# Patient Record
Sex: Female | Born: 1965 | Race: White | Hispanic: No | Marital: Married | Smoking: Never smoker
Health system: Southern US, Community
[De-identification: ages and names within clinical notes are randomized; demographics above are authoritative.]

## PROBLEM LIST (undated history)

## (undated) ENCOUNTER — Emergency Department (HOSPITAL_BASED_OUTPATIENT_CLINIC_OR_DEPARTMENT_OTHER): Payer: BC Managed Care – PPO | Source: Home / Self Care

## (undated) DIAGNOSIS — R030 Elevated blood-pressure reading, without diagnosis of hypertension: Secondary | ICD-10-CM

## (undated) DIAGNOSIS — I10 Essential (primary) hypertension: Secondary | ICD-10-CM

## (undated) DIAGNOSIS — M5090 Cervical disc disorder, unspecified, unspecified cervical region: Secondary | ICD-10-CM

## (undated) DIAGNOSIS — M7541 Impingement syndrome of right shoulder: Secondary | ICD-10-CM

## (undated) HISTORY — PX: TONSILLECTOMY: SUR1361

## (undated) HISTORY — DX: Cervical disc disorder, unspecified, unspecified cervical region: M50.90

## (undated) HISTORY — DX: Elevated blood-pressure reading, without diagnosis of hypertension: R03.0

## (undated) HISTORY — DX: Essential (primary) hypertension: I10

## (undated) HISTORY — PX: TOTAL SHOULDER ARTHROPLASTY: SHX126

---

## 1999-02-27 ENCOUNTER — Other Ambulatory Visit: Admission: RE | Admit: 1999-02-27 | Discharge: 1999-02-27 | Payer: Self-pay | Admitting: *Deleted

## 2000-03-09 ENCOUNTER — Inpatient Hospital Stay (HOSPITAL_COMMUNITY): Admission: RE | Admit: 2000-03-09 | Discharge: 2000-03-11 | Payer: Self-pay | Admitting: *Deleted

## 2000-03-09 HISTORY — PX: VAGINAL HYSTERECTOMY: SUR661

## 2005-07-09 ENCOUNTER — Emergency Department (HOSPITAL_COMMUNITY): Admission: EM | Admit: 2005-07-09 | Discharge: 2005-07-09 | Payer: Self-pay | Admitting: Emergency Medicine

## 2010-07-08 ENCOUNTER — Other Ambulatory Visit: Payer: Self-pay | Admitting: Chiropractic Medicine

## 2010-07-08 ENCOUNTER — Ambulatory Visit
Admission: RE | Admit: 2010-07-08 | Discharge: 2010-07-08 | Disposition: A | Discharge status: Home / Self Care | Payer: Self-pay | Source: Ambulatory Visit | Attending: Chiropractic Medicine | Admitting: Chiropractic Medicine

## 2010-07-08 DIAGNOSIS — R609 Edema, unspecified: Secondary | ICD-10-CM

## 2010-08-03 ENCOUNTER — Other Ambulatory Visit: Payer: Self-pay | Admitting: Chiropractic Medicine

## 2010-08-03 DIAGNOSIS — M25561 Pain in right knee: Secondary | ICD-10-CM

## 2010-08-05 ENCOUNTER — Ambulatory Visit
Admission: RE | Admit: 2010-08-05 | Discharge: 2010-08-05 | Disposition: A | Discharge status: Home / Self Care | Payer: 59 | Source: Ambulatory Visit | Attending: Chiropractic Medicine | Admitting: Chiropractic Medicine

## 2010-08-05 DIAGNOSIS — M25561 Pain in right knee: Secondary | ICD-10-CM

## 2011-05-18 HISTORY — PX: SHOULDER SURGERY: SHX246

## 2011-07-01 ENCOUNTER — Other Ambulatory Visit: Payer: Self-pay | Admitting: Otolaryngology

## 2011-07-06 ENCOUNTER — Encounter (HOSPITAL_BASED_OUTPATIENT_CLINIC_OR_DEPARTMENT_OTHER): Payer: Self-pay | Admitting: *Deleted

## 2011-07-06 NOTE — Progress Notes (Signed)
NPO AFTER MN WITH EXCEPTION WATER/ GATORADE UNTIL 0700. NEEDS HG. 

## 2011-07-09 ENCOUNTER — Encounter (HOSPITAL_BASED_OUTPATIENT_CLINIC_OR_DEPARTMENT_OTHER): Payer: Self-pay | Admitting: Anesthesiology

## 2011-07-09 ENCOUNTER — Encounter (HOSPITAL_BASED_OUTPATIENT_CLINIC_OR_DEPARTMENT_OTHER): Payer: Self-pay | Admitting: *Deleted

## 2011-07-09 ENCOUNTER — Ambulatory Visit (HOSPITAL_BASED_OUTPATIENT_CLINIC_OR_DEPARTMENT_OTHER): Payer: BC Managed Care – PPO | Admitting: Anesthesiology

## 2011-07-09 ENCOUNTER — Encounter (HOSPITAL_BASED_OUTPATIENT_CLINIC_OR_DEPARTMENT_OTHER)
Admission: RE | Disposition: A | Discharge status: Home / Self Care | Payer: Self-pay | Source: Ambulatory Visit | Attending: Specialist

## 2011-07-09 ENCOUNTER — Ambulatory Visit (HOSPITAL_BASED_OUTPATIENT_CLINIC_OR_DEPARTMENT_OTHER)
Admission: RE | Admit: 2011-07-09 | Discharge: 2011-07-09 | Disposition: A | Discharge status: Home / Self Care | Payer: BC Managed Care – PPO | Source: Ambulatory Visit | Attending: Specialist | Admitting: Specialist

## 2011-07-09 DIAGNOSIS — M24119 Other articular cartilage disorders, unspecified shoulder: Secondary | ICD-10-CM | POA: Insufficient documentation

## 2011-07-09 DIAGNOSIS — M25519 Pain in unspecified shoulder: Secondary | ICD-10-CM | POA: Insufficient documentation

## 2011-07-09 DIAGNOSIS — M25819 Other specified joint disorders, unspecified shoulder: Secondary | ICD-10-CM | POA: Insufficient documentation

## 2011-07-09 DIAGNOSIS — M7540 Impingement syndrome of unspecified shoulder: Secondary | ICD-10-CM

## 2011-07-09 DIAGNOSIS — M249 Joint derangement, unspecified: Secondary | ICD-10-CM | POA: Insufficient documentation

## 2011-07-09 DIAGNOSIS — M6580 Other synovitis and tenosynovitis, unspecified site: Secondary | ICD-10-CM | POA: Insufficient documentation

## 2011-07-09 HISTORY — DX: Impingement syndrome of right shoulder: M75.41

## 2011-07-09 SURGERY — SHOULDER ARTHROSCOPY WITH SUBACROMIAL DECOMPRESSION
Anesthesia: General | Site: Shoulder | Laterality: Right | Wound class: Clean

## 2011-07-09 MED ORDER — LIDOCAINE HCL (CARDIAC) 20 MG/ML IV SOLN
INTRAVENOUS | Status: DC | PRN
  Administered 2011-07-09: 50 mg via INTRAVENOUS

## 2011-07-09 MED ORDER — BUPIVACAINE-EPINEPHRINE 0.25% -1:200000 IJ SOLN
INTRAMUSCULAR | Status: DC | PRN
  Administered 2011-07-09: 20 mL

## 2011-07-09 MED ORDER — FENTANYL CITRATE 0.05 MG/ML IJ SOLN
INTRAMUSCULAR | Status: DC | PRN
  Administered 2011-07-09: 100 ug via INTRAVENOUS

## 2011-07-09 MED ORDER — PROPOFOL 10 MG/ML IV EMUL
INTRAVENOUS | Status: DC | PRN
  Administered 2011-07-09: 200 mg via INTRAVENOUS

## 2011-07-09 MED ORDER — CEFAZOLIN SODIUM 1-5 GM-% IV SOLN
1.0000 g | INTRAVENOUS | Status: AC
  Administered 2011-07-09: 1 g via INTRAVENOUS

## 2011-07-09 MED ORDER — CHLORHEXIDINE GLUCONATE 4 % EX LIQD: 60.0000 mL | Freq: Once | CUTANEOUS | Status: DC

## 2011-07-09 MED ORDER — MIDAZOLAM HCL 5 MG/5ML IJ SOLN
INTRAMUSCULAR | Status: DC | PRN
  Administered 2011-07-09: 2 mg via INTRAVENOUS

## 2011-07-09 MED ORDER — SUCCINYLCHOLINE CHLORIDE 20 MG/ML IJ SOLN
INTRAMUSCULAR | Status: DC | PRN
  Administered 2011-07-09: 100 mg via INTRAVENOUS

## 2011-07-09 MED ORDER — ROPIVACAINE HCL 5 MG/ML IJ SOLN
INTRAMUSCULAR | Status: DC | PRN
  Administered 2011-07-09: 20 mL

## 2011-07-09 MED ORDER — HYDROCODONE-ACETAMINOPHEN 7.5-325 MG PO TABS: 1.0000 | ORAL_TABLET | ORAL | Status: AC | PRN

## 2011-07-09 MED ORDER — DEXAMETHASONE SODIUM PHOSPHATE 4 MG/ML IJ SOLN
INTRAMUSCULAR | Status: DC | PRN
  Administered 2011-07-09: 10 mg via INTRAVENOUS

## 2011-07-09 MED ORDER — LACTATED RINGERS IV SOLN: INTRAVENOUS | Status: DC

## 2011-07-09 MED ORDER — SODIUM CHLORIDE 0.9 % IR SOLN
Status: DC | PRN
  Administered 2011-07-09: 6000 mL

## 2011-07-09 MED ORDER — METHOCARBAMOL 500 MG PO TABS: 500.0000 mg | ORAL_TABLET | Freq: Four times a day (QID) | ORAL | Status: AC

## 2011-07-09 MED ORDER — KETOROLAC TROMETHAMINE 30 MG/ML IJ SOLN: 15.0000 mg | Freq: Once | INTRAMUSCULAR | Status: DC | PRN

## 2011-07-09 MED ORDER — EPINEPHRINE HCL 1 MG/ML IJ SOLN
INTRAMUSCULAR | Status: DC | PRN
  Administered 2011-07-09: 2 mg

## 2011-07-09 MED ORDER — KETOROLAC TROMETHAMINE 10 MG PO TABS: 10.0000 mg | ORAL_TABLET | Freq: Four times a day (QID) | ORAL | Status: AC | PRN

## 2011-07-09 MED ORDER — ONDANSETRON HCL 4 MG/2ML IJ SOLN
INTRAMUSCULAR | Status: DC | PRN
  Administered 2011-07-09: 4 mg via INTRAVENOUS

## 2011-07-09 MED ORDER — HYDROMORPHONE HCL PF 1 MG/ML IJ SOLN: 0.2500 mg | INTRAMUSCULAR | Status: DC | PRN

## 2011-07-09 MED ORDER — PROMETHAZINE HCL 25 MG/ML IJ SOLN: 6.2500 mg | INTRAMUSCULAR | Status: DC | PRN

## 2011-07-09 MED ORDER — LACTATED RINGERS IV SOLN
INTRAVENOUS | Status: DC
  Administered 2011-07-09: 12:00:00 via INTRAVENOUS

## 2011-07-09 MED ORDER — MIDAZOLAM HCL 2 MG/2ML IJ SOLN
2.0000 mg | Freq: Once | INTRAMUSCULAR | Status: AC
  Administered 2011-07-09: 2 mg via INTRAVENOUS

## 2011-07-09 MED ORDER — FENTANYL CITRATE 0.05 MG/ML IJ SOLN
100.0000 ug | Freq: Once | INTRAMUSCULAR | Status: AC
  Administered 2011-07-09: 100 ug via INTRAVENOUS

## 2011-07-09 SURGICAL SUPPLY — 70 items
APL SKNCLS STERI-STRIP NONHPOA (GAUZE/BANDAGES/DRESSINGS)
BENZOIN TINCTURE PRP APPL 2/3 (GAUZE/BANDAGES/DRESSINGS) IMPLANT
BLADE 4.2CUDA (BLADE) ×2 IMPLANT
BLADE CUDA SHAVER 3.5 (BLADE) ×2 IMPLANT
BLADE CUTTER GATOR 3.5 (BLADE) IMPLANT
BLADE FLAT COURSE (BLADE) IMPLANT
BLADE GREAT WHITE 4.2 (BLADE) IMPLANT
BLADE SURG 11 STRL SS (BLADE) ×2 IMPLANT
BNDG COHESIVE 4X5 TAN NS LF (GAUZE/BANDAGES/DRESSINGS) ×2 IMPLANT
BUR OVAL 4.0 (BURR) IMPLANT
BUR VERTEX HOODED 4.5 (BURR) IMPLANT
CANISTER SUCT LVC 12 LTR MEDI- (MISCELLANEOUS) ×2 IMPLANT
CANISTER SUCTION 2500CC (MISCELLANEOUS) IMPLANT
CANNULA ACUFLEX KIT 5X76 (CANNULA) ×2 IMPLANT
CANNULA SHOULDER 7CM (CANNULA) IMPLANT
CANNULA TWIST IN 8.25X7CM (CANNULA) IMPLANT
CLEANER CAUTERY TIP 5X5 PAD (MISCELLANEOUS) IMPLANT
CLOTH BEACON ORANGE TIMEOUT ST (SAFETY) ×2 IMPLANT
DRAPE LG THREE QUARTER DISP (DRAPES) IMPLANT
DRAPE STERI 35X30 U-POUCH (DRAPES) ×2 IMPLANT
DRAPE U 20/CS (DRAPES) ×4 IMPLANT
DRSG PAD ABDOMINAL 8X10 ST (GAUZE/BANDAGES/DRESSINGS) ×2 IMPLANT
DURAPREP 26ML APPLICATOR (WOUND CARE) ×2 IMPLANT
ELECT MENISCUS 165MM 90D (ELECTRODE) IMPLANT
ELECT NEEDLE TIP 2.8 STRL (NEEDLE) ×2 IMPLANT
ELECT REM PT RETURN 9FT ADLT (ELECTROSURGICAL) ×2
ELECTRODE REM PT RTRN 9FT ADLT (ELECTROSURGICAL) ×1 IMPLANT
GLOVE BIO SURGEON STRL SZ 6.5 (GLOVE) ×2 IMPLANT
GLOVE INDICATOR 7.0 STRL GRN (GLOVE) ×2 IMPLANT
GLOVE SURG SS PI 8.0 STRL IVOR (GLOVE) ×2 IMPLANT
GOWN STRL NON-REIN LRG LVL3 (GOWN DISPOSABLE) ×2 IMPLANT
NDL SAFETY ECLIPSE 18X1.5 (NEEDLE) ×1 IMPLANT
NDL SUT 6 .5 CRC .975X.05 MAYO (NEEDLE) IMPLANT
NEEDLE 1/2 CIR CATGUT .05X1.09 (NEEDLE) IMPLANT
NEEDLE FILTER BLUNT 18X 1/2SAF (NEEDLE) ×1
NEEDLE FILTER BLUNT 18X1 1/2 (NEEDLE) ×1 IMPLANT
NEEDLE HYPO 18GX1.5 SHARP (NEEDLE) ×2
NEEDLE MAYO TAPER (NEEDLE)
NEEDLE SPNL 18GX3.5 QUINCKE PK (NEEDLE) ×2 IMPLANT
NS IRRIG 500ML POUR BTL (IV SOLUTION) IMPLANT
PACK BASIN DAY SURGERY FS (CUSTOM PROCEDURE TRAY) ×2 IMPLANT
PACK SHOULDER CUSTOM OPM052 (CUSTOM PROCEDURE TRAY) ×2 IMPLANT
PAD CLEANER CAUTERY TIP 5X5 (MISCELLANEOUS)
SET ARTHROSCOPY TUBING (MISCELLANEOUS) ×2
SET ARTHROSCOPY TUBING LN (MISCELLANEOUS) ×1 IMPLANT
SLING ARM FOAM STRAP LRG (SOFTGOODS) IMPLANT
SLING ULTRA II LARGE (SOFTGOODS) IMPLANT
SPONGE GAUZE 4X4 12PLY (GAUZE/BANDAGES/DRESSINGS) ×2 IMPLANT
STAPLER VISISTAT 35W (STAPLE) IMPLANT
STRIP CLOSURE SKIN 1/2X4 (GAUZE/BANDAGES/DRESSINGS) IMPLANT
SUT BONE WAX W31G (SUTURE) IMPLANT
SUT ETHIBOND 1 OS 2 18 CR3 (SUTURE) IMPLANT
SUT ETHIBOND 2 OS 4 DA (SUTURE) IMPLANT
SUT ETHILON 4 0 PS 2 18 (SUTURE) ×2 IMPLANT
SUT FIBERWIRE #2 38 REV NDL BL (SUTURE)
SUT MON AB 4-0 PC3 18 (SUTURE) IMPLANT
SUT PDS AB 0 CT 36 (SUTURE) IMPLANT
SUT PROLENE 3 0 PS 2 (SUTURE) IMPLANT
SUT VIC AB 1 CT1 36 (SUTURE) IMPLANT
SUT VIC AB 1-0 CT2 27 (SUTURE) IMPLANT
SUT VIC AB 2-0 CT2 27 (SUTURE) ×2 IMPLANT
SUT VICRYL 0 UR6 27IN ABS (SUTURE) ×4 IMPLANT
SUT VICRYL 0-0 OS 2 NEEDLE (SUTURE) IMPLANT
SUTURE FIBERWR#2 38 REV NDL BL (SUTURE) IMPLANT
SYRINGE 10CC LL (SYRINGE) ×2 IMPLANT
TAPE CLOTH SURG 6X10 WHT LF (GAUZE/BANDAGES/DRESSINGS) ×2 IMPLANT
TAPE HYPAFIX 6X30 (GAUZE/BANDAGES/DRESSINGS) ×2 IMPLANT
TOWEL OR 17X24 6PK STRL BLUE (TOWEL DISPOSABLE) ×4 IMPLANT
WAND 90 DEG TURBOVAC W/CORD (SURGICAL WAND) IMPLANT
WATER STERILE IRR 500ML POUR (IV SOLUTION) ×2 IMPLANT

## 2011-07-09 NOTE — H&P (View-Only) (Signed)
NPO AFTER MN WITH EXCEPTION WATER/ GATORADE UNTIL 0700. NEEDS HG.

## 2011-07-09 NOTE — Anesthesia Postprocedure Evaluation (Signed)
  Anesthesia Post-op Note  Patient: Karen Gregory  Procedure(s) Performed: Procedure(s) (LRB): SHOULDER ARTHROSCOPY WITH SUBACROMIAL DECOMPRESSION (Right)  Patient Location: PACU  Anesthesia Type: GA combined with regional for post-op pain  Level of Consciousness: awake and alert   Airway and Oxygen Therapy: Patient Spontanous Breathing  Post-op Pain: mild  Post-op Assessment: Post-op Vital signs reviewed, Patient's Cardiovascular Status Stable, Respiratory Function Stable, Patent Airway and No signs of Nausea or vomiting  Post-op Vital Signs: stable  Complications: No apparent anesthesia complications

## 2011-07-09 NOTE — Brief Op Note (Signed)
07/09/2011  1:48 PM  PATIENT:  Karen Gregory  46 y.o. female  PRE-OPERATIVE DIAGNOSIS:  RIGHT SHOULDER IMPINGEMENT  POST-OPERATIVE DIAGNOSIS:  RIGHT SHOULDER IMPINGEMENT  PROCEDURE:  Procedure(s) (LRB): SHOULDER ARTHROSCOPY WITH SUBACROMIAL DECOMPRESSION (Right)  SURGEON:  Surgeon(s) and Role:    * Javier Docker, MD - Primary  PHYSICIAN ASSISTANT:   ASSISTANTS: [place]   ANESTHESIA:   general  EBL:  Total I/O In: 100 [I.V.:100] Out: -   BLOOD ADMINISTERED:none  DRAINS: none   LOCAL MEDICATIONS USED:  MARCAINE     SPECIMEN:  No Specimen  DISPOSITION OF SPECIMEN:  N/A  COUNTS:  YES  TOURNIQUET:  * No tourniquets in log *  DICTATION: .dictated into system under medical record number. Did not hear dictation number.  PLAN OF CARE: Discharge to home after PACU  PATIENT DISPOSITION:  PACU - hemodynamically stable.   Delay start of Pharmacological VTE agent (>24hrs) due to surgical blood loss or risk of bleeding: no

## 2011-07-09 NOTE — H&P (Signed)
Karen Gregory is an 46 y.o. female.   Chief Complaint: right shoulder pain HPI:  Patient notes persistent  right shoulder pain.  MRI findings show tendonitis/bursitis of the shoulder.  Patient has failed conservative treatment.  It is felt at this time they would benefit from surgical intervention.  Risks and benefits of surgery discussed with patient and they wish to proceed. Past Medical History  Diagnosis Date  . Impingement syndrome of right shoulder     Past Surgical History  Procedure Date  . Tonsillectomy age 56  . Vaginal hysterectomy 03-09-2000    W/ LEFT SALPINGOOPHECTOMY    History reviewed. No pertinent family history. Social History:  reports that she has never smoked. She has never used smokeless tobacco. She reports that she does not drink alcohol or use illicit drugs.  Allergies: No Known Allergies  Medications Prior to Admission  Medication Dose Route Frequency Provider Last Rate Last Dose  . ceFAZolin (ANCEF) IVPB 1 g/50 mL premix  1 g Intravenous 60 min Pre-Op Liam Graham, PA      . chlorhexidine (HIBICLENS) 4 % liquid 4 application  60 mL Topical Once Liam Graham, PA      . lactated ringers infusion   Intravenous Continuous Riesa Pope, MD      . lactated ringers infusion   Intravenous Continuous Liam Graham, PA       Medications Prior to Admission  Medication Sig Dispense Refill  . buPROPion (WELLBUTRIN XL) 300 MG 24 hr tablet Take 300 mg by mouth daily.        No results found for this or any previous visit (from the past 48 hour(s)). No results found.  Review of Systems: The patient denies any fever, chills, night sweats, or bleeding tendencies. CNS: No blurred double vision, seizure, headache, paralysis. RESPIRATORY: No shortness of breath, productive cough, or hemoptysis. CARDIOVASCULAR: No chest pain, angina, or orthopnea, GU: No dysuria, hematuria, or discharge. MUSCULOSKELETAL: Pertinent per HPI.  Blood pressure 120/86,  pulse 88, temperature 96.8 F (36 C), temperature source Oral, resp. rate 18, height 5\' 2"  (1.575 m), weight 74.844 kg (165 lb), SpO2 98.00%.  GENERAL: Patient is a 47 y.o. female, well-nourished, well-developed, no acute distress. Alert, oriented, cooperative. HENT:  Normocephalic, atraumatic. Pupils round and reactive. EOMs intact. NECK:  Supple, no bruits. CHEST:  Clear to anterior and posterior chest walls. No rhonchi, rales, wheezes. HEART:  Regular, rate and rhythm.  No murmurs.  S1 and S2 noted. ABDOMEN:  Soft, nontender, bowel sounds present. RECTAL/BREAST/GENITALIA:  Not done, not pertinent to present illness. EXTREMITIES:  Positive impingement with loss of ROM right shoulder  Assessment/Plan MUA, EUA with shoulder arthroscopy and SAD right shoulder  Philander Ake R. 07/09/2011, 11:40 AM

## 2011-07-09 NOTE — Anesthesia Procedure Notes (Addendum)
Anesthesia Regional Block:  Interscalene brachial plexus block  Pre-Anesthetic Checklist: ,, timeout performed, Correct Patient, Correct Site, Correct Laterality, Correct Procedure, Correct Position, site marked, Risks and benefits discussed,  Surgical consent,  Pre-op evaluation,  At surgeon's request and post-op pain management   Prep: chloraprep       Needles:  Injection technique: Single-shot  Needle Type: Echogenic Stimulator Needle          Additional Needles:  Procedures: ultrasound guided Interscalene brachial plexus block Narrative:  Start time: 07/09/2011 12:51 PM End time: 07/09/2011 12:51 PM Injection made incrementally with aspirations every 5 mL.  Performed by: Personally  Anesthesiologist: Eilene Ghazi MD  Additional Notes: Patient tolerated the procedure well without complications  Interscalene brachial plexus block Procedure Name: Intubation Performed by: Maris Berger Pre-anesthesia Checklist: Patient identified, Emergency Drugs available, Suction available and Patient being monitored Patient Re-evaluated:Patient Re-evaluated prior to inductionOxygen Delivery Method: Circle System Utilized Preoxygenation: Pre-oxygenation with 100% oxygen Intubation Type: IV induction Ventilation: Mask ventilation without difficulty Laryngoscope Size: 3 and Mac Grade View: Grade I Tube type: Oral Number of attempts: 1 Airway Equipment and Method: stylet and oral airway Placement Confirmation: ETT inserted through vocal cords under direct vision,  positive ETCO2 and breath sounds checked- equal and bilateral Secured at: 21 cm Tube secured with: Tape Dental Injury: Teeth and Oropharynx as per pre-operative assessment  Comments: 4%LTA

## 2011-07-09 NOTE — Interval H&P Note (Signed)
History and Physical Interval Note:  07/09/2011 1:02 PM  Karen Gregory  has presented today for surgery, with the diagnosis of RIGHT SHOULDER IMPINGEMENT  The various methods of treatment have been discussed with the patient and family. After consideration of risks, benefits and other options for treatment, the patient has consented to  Procedure(s) (LRB): SHOULDER ARTHROSCOPY WITH SUBACROMIAL DECOMPRESSION (Right) as a surgical intervention .  The patients' history has been reviewed, patient examined, no change in status, stable for surgery.  I have reviewed the patients' chart and labs.  Questions were answered to the patient's satisfaction.     [name] C

## 2011-07-09 NOTE — Anesthesia Preprocedure Evaluation (Signed)
Anesthesia Evaluation  Patient identified by MRN, date of birth, ID band Patient awake    Reviewed: Allergy & Precautions, H&P , NPO status , Patient's Chart, lab work & pertinent test results  Airway Mallampati: II TM Distance: >3 FB Neck ROM: Full    Dental No notable dental hx.    Pulmonary neg pulmonary ROS,  clear to auscultation  Pulmonary exam normal       Cardiovascular neg cardio ROS Regular Normal    Neuro/Psych Negative Neurological ROS  Negative Psych ROS   GI/Hepatic negative GI ROS, Neg liver ROS,   Endo/Other  Negative Endocrine ROS  Renal/GU negative Renal ROS  Genitourinary negative   Musculoskeletal negative musculoskeletal ROS (+)   Abdominal   Peds negative pediatric ROS (+)  Hematology negative hematology ROS (+)   Anesthesia Other Findings   Reproductive/Obstetrics negative OB ROS                          Anesthesia Physical Anesthesia Plan  ASA: I  Anesthesia Plan: General   Post-op Pain Management:    Induction: Intravenous  Airway Management Planned: Oral ETT  Additional Equipment:   Intra-op Plan:   Post-operative Plan: Extubation in OR  Informed Consent: I have reviewed the patients History and Physical, chart, labs and discussed the procedure including the risks, benefits and alternatives for the proposed anesthesia with the patient or authorized representative who has indicated his/her understanding and acceptance.   Dental advisory given  Plan Discussed with: CRNA  Anesthesia Plan Comments:         Anesthesia Quick Evaluation  

## 2011-07-09 NOTE — Transfer of Care (Signed)
Immediate Anesthesia Transfer of Care Note  Patient: Karen Gregory  Procedure(s) Performed: Procedure(s) (LRB): SHOULDER ARTHROSCOPY WITH SUBACROMIAL DECOMPRESSION (Right)  Patient Location: PACU  Anesthesia Type: General  Level of Consciousness: alert  and oriented  Airway & Oxygen Therapy: Patient Spontanous Breathing and Patient connected to face mask oxygen  Post-op Assessment: Report given to PACU RN  Post vital signs: Reviewed and stable  Complications: No apparent anesthesia complications

## 2011-07-12 NOTE — Op Note (Signed)
NAME:  Karen Gregory, Karen Gregory              ACCOUNT NO.:  [id number]  MEDICAL RECORD NO.:  [id number]  LOCATION:                               FACILITY:  [facility]  PHYSICIAN:  Jene Every, M.D.    DATE OF BIRTH:  19-May-1965  DATE OF PROCEDURE: DATE OF DISCHARGE:                              OPERATIVE REPORT   PREOPERATIVE DIAGNOSIS:  Impingement syndrome, adhesive capsulitis of the shoulder.  POSTOPERATIVE DIAGNOSIS:  Impingement syndrome, anterior labral tear, laxity of the glenohumeral joint.  PROCEDURE PERFORMED: 1. Right shoulder arthroscopy with debridement of anterior labrum. 2. Bursectomy and subacromial decompression. 3. Exam under anesthesia.  HISTORY:  This is a pleasant __________ patient who has had a history of shoulder pain, had temporary relief from injection, had limited range of motion of the shoulder.  MRI indicating some adhesive capsulitis and bursitis.  She was indicated for exam under anesthesia followed by manipulation and subacromial decompression, failing conservative treatment including therapy, activity modifications, corticosteroid injection.  Risks and benefits discussed including bleeding, infection, no change in symptoms, worsening symptoms, need for postoperative therapy, DVT, PE, anesthetic complications etc.  Patient had initial pain, which was secondary to performing triceps exercises posteriorly. Felt that it initially started as impingement-type syndrome, then followed into restricted range of motion of the shoulder. She had no history of dislocation or instability.  TECHNIQUE:  Patient in supine position, after induction of adequate general anesthesia, 1 g Kefzol, we examined her under anesthesia. Patient had full range of motion of the shoulder under anesthesia including abduction, forward flexion,  external and internal rotation, however, had no significant sulcus sign.  She had slightly increased translation anteriorly and posteriorly.  With the  arm in 70:30 position, after prepping and draping the upper extremity and surgical marker utilized along the acromion, AC joint, and coracoid, I made a small posterolateral incision for a posterolateral portal with the arm in 70:30 position, advanced cannula in the glenohumeral joint penetrating atraumatically.  Inspection of the joint revealed small fraying and tearing of the anterior labrum into the joint, some synovitis of biceps. Anchor, however, appeared to be intact.  The biceps tendon appeared to be intact.  The rotator cuff was without evidence of tear.  I fashioned anterior portal with localization with 18-gauge needle beneath the biceps tendon and a small incision through the skin only.  I advanced the cannula into the glenohumeral joint.  I advanced the probe.  There was some small tearing and fraying of the labrum, but it was detached and was intact.  I introduced a shaver and debrided the small tear and synovitis.  Subscap was unremarkable.  With direct examination and anterior-posterior translation, did appear to have increasing laxity noted.  I then redirected the camera in subacromial space  and made a small anterolateral incision through the skin only triangulating the subacromial space.  Exuberant bursitis was noted in the subacromial space.  This was shaved and debrided.  I checked it anteriorly and posteriorly with some hyperemic areas of the rotator cuff posteriorly, but no evidence of any tear.  This was probed extensively.  She did not have an impingement lesion.  I did not feel the necessity to  resect the CA ligament and perform the  acromioplasty.  After the bursectomy and full inspection of the cuff, I then removed all instrumentation. Portals were closed with 4-0 nylon simple sutures.  0.25% Marcaine with epinephrine was infiltrated in the joint.  Wound was dressed sterilely, placed in a sling, extubated without difficulty, and transported to recovery room in  satisfactory condition.  Patient tolerated the procedure well.  No complications.  Assistant Roma Schanz helped in general intermittent traction of the upper extremity and arthroscopic effluent.     Jene Every, M.D.     Cordelia Pen  D:  07/09/2011  T:  07/10/2011  Job:  [id number]

## 2012-08-13 ENCOUNTER — Emergency Department (HOSPITAL_BASED_OUTPATIENT_CLINIC_OR_DEPARTMENT_OTHER): Payer: BC Managed Care – PPO

## 2012-08-13 ENCOUNTER — Emergency Department (HOSPITAL_BASED_OUTPATIENT_CLINIC_OR_DEPARTMENT_OTHER)
Admission: EM | Admit: 2012-08-13 | Discharge: 2012-08-14 | Disposition: A | Discharge status: Home / Self Care | Payer: BC Managed Care – PPO | Attending: Emergency Medicine | Admitting: Emergency Medicine

## 2012-08-13 ENCOUNTER — Encounter (HOSPITAL_BASED_OUTPATIENT_CLINIC_OR_DEPARTMENT_OTHER): Payer: Self-pay | Admitting: *Deleted

## 2012-08-13 DIAGNOSIS — R509 Fever, unspecified: Secondary | ICD-10-CM | POA: Insufficient documentation

## 2012-08-13 DIAGNOSIS — J40 Bronchitis, not specified as acute or chronic: Secondary | ICD-10-CM

## 2012-08-13 DIAGNOSIS — Z8739 Personal history of other diseases of the musculoskeletal system and connective tissue: Secondary | ICD-10-CM | POA: Insufficient documentation

## 2012-08-13 DIAGNOSIS — J209 Acute bronchitis, unspecified: Secondary | ICD-10-CM | POA: Insufficient documentation

## 2012-08-13 DIAGNOSIS — J029 Acute pharyngitis, unspecified: Secondary | ICD-10-CM | POA: Insufficient documentation

## 2012-08-13 DIAGNOSIS — R079 Chest pain, unspecified: Secondary | ICD-10-CM | POA: Insufficient documentation

## 2012-08-13 DIAGNOSIS — R52 Pain, unspecified: Secondary | ICD-10-CM | POA: Insufficient documentation

## 2012-08-13 LAB — BASIC METABOLIC PANEL
BUN: 7 mg/dL (ref 6–23)
CO2: 27 mEq/L (ref 19–32)
Chloride: 101 mEq/L (ref 96–112)
GFR calc Af Amer: >90 mL/min (ref >90)
Glucose, Bld: 96 mg/dL (ref 70–99)
Potassium: 3.4 mEq/L — ABNORMAL LOW (ref 3.5–5.1)

## 2012-08-13 LAB — CBC WITH DIFFERENTIAL/PLATELET
Basophils Relative: 0 % (ref 0–1)
HCT: 38.9 % (ref 36.0–46.0)
Hemoglobin: 13.5 g/dL (ref 12.0–15.0)
Lymphocytes Relative: 29 % (ref 12–46)
Lymphs Abs: 1.7 10*3/uL (ref 0.7–4.0)
Monocytes Relative: 13 % — ABNORMAL HIGH (ref 3–12)
Neutro Abs: 3.3 10*3/uL (ref 1.7–7.7)
Neutrophils Relative %: 56 % (ref 43–77)
RBC: 4.29 MIL/uL (ref 3.87–5.11)
WBC: 5.9 10*3/uL (ref 4.0–10.5)

## 2012-08-13 MED ORDER — SODIUM CHLORIDE 0.9 % IV SOLN
Freq: Once | INTRAVENOUS | Status: AC
  Administered 2012-08-13: 23:00:00 via INTRAVENOUS

## 2012-08-13 MED ORDER — IOHEXOL 350 MG/ML SOLN
100.0000 mL | Freq: Once | INTRAVENOUS | Status: AC | PRN
  Administered 2012-08-13: 100 mL via INTRAVENOUS

## 2012-08-13 NOTE — ED Provider Notes (Signed)
History     CSN: [id number]  Arrival date & time 08/13/12  2019   First MD Initiated Contact with Patient 08/13/12 2040      Chief Complaint  Patient presents with  . Cough    (Consider location/radiation/quality/duration/timing/severity/associated sxs/prior treatment) Patient is a 47 y.o. female presenting with cough. The history is provided by the patient. No language interpreter was used.  Cough Cough characteristics:  Productive Sputum characteristics:  Green Duration:  4 days Timing:  Constant Progression:  Worsening Chronicity:  New Smoker: no   Relieved by:  Nothing Worsened by:  Deep breathing Ineffective treatments:  None tried Associated symptoms: chest pain   Pt recently traveled to [place] on a bus for a high school chorus trip.   Pt complains of sorethroat, cough bodyaches and fever  Past Medical History  Diagnosis Date  . Impingement syndrome of right shoulder     Past Surgical History  Procedure Laterality Date  . Tonsillectomy  age 16  . Vaginal hysterectomy  03-09-2000    W/ LEFT SALPINGOOPHECTOMY    History reviewed. No pertinent family history.  History  Substance Use Topics  . Smoking status: Never Smoker   . Smokeless tobacco: Never Used  . Alcohol Use: No    OB History   Grav Para Term Preterm Abortions TAB SAB Ect Mult Living                  Review of Systems  Respiratory: Positive for cough.   Cardiovascular: Positive for chest pain.  All other systems reviewed and are negative.    Allergies  Review of patient's allergies indicates no known allergies.  Home Medications   Current Outpatient Rx  Name  Route  Sig  Dispense  Refill  . buPROPion (WELLBUTRIN XL) 300 MG 24 hr tablet   Oral   Take 300 mg by mouth daily.           BP 141/90  Pulse 94  Temp(Src) 98.7 F (37.1 C) (Oral)  Ht 5\' 2"  (1.575 m)  Wt 155 lb (70.308 kg)  BMI 28.34 kg/m2  SpO2 96%  Physical Exam  Vitals reviewed. Constitutional: She is  oriented to person, place, and time. She appears well-developed.  HENT:  Head: Normocephalic.  Right Ear: External ear normal.  Eyes: Conjunctivae are normal. Pupils are equal, round, and reactive to light.  Neck: Normal range of motion. Neck supple.  Cardiovascular: Normal rate, regular rhythm and normal heart sounds.   Pulmonary/Chest: Effort normal. No respiratory distress.  Abdominal: Soft. Bowel sounds are normal.  Musculoskeletal: Normal range of motion.  Neurological: She is alert and oriented to person, place, and time.  Skin: Skin is warm.  Psychiatric: She has a normal mood and affect.    ED Course  Procedures (including critical care time)  Labs Reviewed - No data to display Dg Chest 2 View  08/13/2012  *RADIOLOGY REPORT*  Clinical Data: Cough.  Chest pain.  Short of breath.  CHEST - 2 VIEW  Comparison: None.  Findings: Normal heart.  Low lung volumes.  Lungs are grossly clear.    No pleural effusion.  Tiny less than 5% right apical pneumothorax is suspected.  IMPRESSION: Tiny right apical pneumothorax is suspected.  Left decubitus image may be helpful to confirm.   Original Report Authenticated By: Jolaine Click, M.D.      No diagnosis found.    MDM      Pt's care turned over to Dr. Read Drivers,  Ct scan and final disposition pending.    Lonia Skinner [place]on, PA-C 08/13/12 2353

## 2012-08-13 NOTE — ED Notes (Signed)
Sudden onset cough, sore throat, fever, chills onset Thursday. Prod cough with green sputum. Hurts to take deep breath.

## 2012-08-13 NOTE — ED Provider Notes (Signed)
Medical screening examination/treatment/procedure(s) were performed by non-physician practitioner and as supervising physician I was immediately available for consultation/collaboration.  Gilda Crease, MD 08/13/12 [phone]

## 2012-08-14 MED ORDER — AZITHROMYCIN 250 MG PO TABS
500.0000 mg | ORAL_TABLET | Freq: Once | ORAL | Status: AC
  Administered 2012-08-14: 500 mg via ORAL
  Filled 2012-08-14: qty 2

## 2012-08-14 MED ORDER — AZITHROMYCIN 250 MG PO TABS: ORAL_TABLET | ORAL | Status: DC

## 2012-08-14 MED ORDER — HYDROCODONE-ACETAMINOPHEN 5-325 MG PO TABS
2.0000 | ORAL_TABLET | Freq: Once | ORAL | Status: AC
  Administered 2012-08-14: 2 via ORAL
  Filled 2012-08-14: qty 2

## 2012-08-14 MED ORDER — HYDROCODONE-ACETAMINOPHEN 5-325 MG PO TABS: 2.0000 | ORAL_TABLET | ORAL | Status: DC | PRN

## 2013-05-16 ENCOUNTER — Emergency Department (HOSPITAL_BASED_OUTPATIENT_CLINIC_OR_DEPARTMENT_OTHER)
Admission: EM | Admit: 2013-05-16 | Discharge: 2013-05-16 | Disposition: A | Discharge status: Home / Self Care | Payer: BC Managed Care – PPO | Attending: Emergency Medicine | Admitting: Emergency Medicine

## 2013-05-16 ENCOUNTER — Emergency Department (HOSPITAL_BASED_OUTPATIENT_CLINIC_OR_DEPARTMENT_OTHER): Payer: BC Managed Care – PPO

## 2013-05-16 ENCOUNTER — Encounter (HOSPITAL_BASED_OUTPATIENT_CLINIC_OR_DEPARTMENT_OTHER): Payer: Self-pay | Admitting: Emergency Medicine

## 2013-05-16 DIAGNOSIS — Z79899 Other long term (current) drug therapy: Secondary | ICD-10-CM | POA: Insufficient documentation

## 2013-05-16 DIAGNOSIS — Z8739 Personal history of other diseases of the musculoskeletal system and connective tissue: Secondary | ICD-10-CM | POA: Insufficient documentation

## 2013-05-16 DIAGNOSIS — G43909 Migraine, unspecified, not intractable, without status migrainosus: Secondary | ICD-10-CM

## 2013-05-16 LAB — COMPREHENSIVE METABOLIC PANEL
BUN: 13 mg/dL (ref 6–23)
CO2: 26 mEq/L (ref 19–32)
Chloride: 102 mEq/L (ref 96–112)
Creatinine, Ser: 0.8 mg/dL (ref 0.50–1.10)
GFR calc non Af Amer: 86 mL/min — ABNORMAL LOW (ref >90)
Total Bilirubin: 0.5 mg/dL (ref 0.3–1.2)

## 2013-05-16 LAB — CBC WITH DIFFERENTIAL/PLATELET
Basophils Relative: 0 % (ref 0–1)
Eosinophils Relative: 1 % (ref 0–5)
HCT: 42.4 % (ref 36.0–46.0)
Hemoglobin: 14.4 g/dL (ref 12.0–15.0)
Lymphocytes Relative: 29 % (ref 12–46)
MCHC: 34 g/dL (ref 30.0–36.0)
MCV: 91 fL (ref 78.0–100.0)
Monocytes Absolute: 0.6 10*3/uL (ref 0.1–1.0)
Monocytes Relative: 8 % (ref 3–12)
Neutro Abs: 4.5 10*3/uL (ref 1.7–7.7)

## 2013-05-16 MED ORDER — HYDROMORPHONE HCL PF 1 MG/ML IJ SOLN
1.0000 mg | Freq: Once | INTRAMUSCULAR | Status: AC
  Administered 2013-05-16: 1 mg via INTRAVENOUS
  Filled 2013-05-16: qty 1

## 2013-05-16 MED ORDER — PROCHLORPERAZINE EDISYLATE 5 MG/ML IJ SOLN
10.0000 mg | Freq: Four times a day (QID) | INTRAMUSCULAR | Status: DC | PRN
  Filled 2013-05-16: qty 2

## 2013-05-16 MED ORDER — ONDANSETRON HCL 4 MG/2ML IJ SOLN
4.0000 mg | Freq: Once | INTRAMUSCULAR | Status: AC
  Administered 2013-05-16: 4 mg via INTRAVENOUS
  Filled 2013-05-16: qty 2

## 2013-05-16 NOTE — ED Notes (Signed)
Pt c/o h/a x 3 weeks seen by PMD  For same

## 2013-05-16 NOTE — ED Provider Notes (Signed)
CSN: [id number]     Arrival date & time 05/16/13  1503 History   First MD Initiated Contact with Patient 05/16/13 1533     Chief Complaint  Patient presents with  . Headache   (Consider location/radiation/quality/duration/timing/severity/associated sxs/prior Treatment) Patient is a 47 y.o. female presenting with headaches. The history is provided by the patient.  Headache Pain location:  Occipital Quality:  Sharp Radiates to: to remainder of head. Severity currently:  6/10 Severity at highest:  8/10 Onset quality:  Unable to specify Timing:  Intermittent Progression:  Waxing and waning Chronicity:  Recurrent Similar to prior headaches: yes   Context: emotional stress   Context: not activity, not exposure to bright light, not caffeine and not coughing   Relieved by:  NSAIDs Worsened by:  Nothing tried Ineffective treatments:  None tried Associated symptoms: no abdominal pain, no back pain, no blurred vision, no fever, no focal weakness, no hearing loss, no loss of balance, no photophobia, no seizures and no sinus pressure   Risk factors: no family hx of SAH and lifestyle not sedentary     Past Medical History  Diagnosis Date  . Impingement syndrome of right shoulder    Past Surgical History  Procedure Laterality Date  . Tonsillectomy  age 34  . Vaginal hysterectomy  03-09-2000    W/ LEFT SALPINGOOPHECTOMY   History reviewed. No pertinent family history. History  Substance Use Topics  . Smoking status: Never Smoker   . Smokeless tobacco: Never Used  . Alcohol Use: No   OB History   Grav Para Term Preterm Abortions TAB SAB Ect Mult Living                 Review of Systems  Constitutional: Negative for fever.  HENT: Negative for hearing loss and sinus pressure.   Eyes: Negative for blurred vision and photophobia.  Gastrointestinal: Negative for abdominal pain.  Musculoskeletal: Negative for back pain.  Neurological: Positive for headaches. Negative for focal  weakness, seizures and loss of balance.  All other systems reviewed and are negative.    Allergies  Review of patient's allergies indicates no known allergies.  Home Medications   Current Outpatient Rx  Name  Route  Sig  Dispense  Refill  . metoprolol tartrate (LOPRESSOR) 25 MG tablet   Oral   Take 25 mg by mouth 2 (two) times daily.         . naproxen sodium (ANAPROX) 220 MG tablet   Oral   Take 220 mg by mouth 2 (two) times daily with a meal.         . pantoprazole (PROTONIX) 40 MG tablet   Oral   Take 40 mg by mouth daily.         Marland Kitchen azithromycin (ZITHROMAX Z-PAK) 250 MG tablet      One po once a day   4 tablet   0   . buPROPion (WELLBUTRIN XL) 300 MG 24 hr tablet   Oral   Take 300 mg by mouth daily.         Marland Kitchen HYDROcodone-acetaminophen (NORCO/VICODIN) 5-325 MG per tablet   Oral   Take 2 tablets by mouth every 4 (four) hours as needed for pain.   20 tablet   0    BP 142/94  Pulse 86  Temp(Src) 97.7 F (36.5 C) (Oral)  Ht 5\' 2"  (1.575 m)  Wt 170 lb (77.111 kg)  BMI 31.09 kg/m2  SpO2 100% Physical Exam  Nursing note and vitals reviewed.  Constitutional: She is oriented to person, place, and time. She appears well-developed and well-nourished.  HENT:  Head: Normocephalic and atraumatic.  Right Ear: Tympanic membrane and external ear normal.  Left Ear: Tympanic membrane and external ear normal.  Nose: Nose normal. Right sinus exhibits no maxillary sinus tenderness and no frontal sinus tenderness. Left sinus exhibits no maxillary sinus tenderness and no frontal sinus tenderness.  Eyes: Conjunctivae and EOM are normal. Pupils are equal, round, and reactive to light. Right eye exhibits no nystagmus. Left eye exhibits no nystagmus.  Neck: Normal range of motion. Neck supple.  Cardiovascular: Normal rate, regular rhythm, normal heart sounds and intact distal pulses.   Pulmonary/Chest: Effort normal and breath sounds normal. No respiratory distress. She  exhibits no tenderness.  Abdominal: Soft. Bowel sounds are normal. She exhibits no distension and no mass. There is no tenderness.  Musculoskeletal: Normal range of motion. She exhibits no edema and no tenderness.  Neurological: She is alert and oriented to person, place, and time. She has normal strength and normal reflexes. No sensory deficit. She displays a negative Romberg sign. GCS eye subscore is 4. GCS verbal subscore is 5. GCS motor subscore is 6.  Reflex Scores:      Tricep reflexes are 2+ on the right side and 2+ on the left side.      Bicep reflexes are 2+ on the right side and 2+ on the left side.      Brachioradialis reflexes are 2+ on the right side and 2+ on the left side.      Patellar reflexes are 2+ on the right side and 2+ on the left side.      Achilles reflexes are 2+ on the right side and 2+ on the left side. Patient with normal gait without ataxia, shuffling, spasm, or antalgia. Speech is normal without dysarthria, dysphasia, or aphasia. Muscle strength is 5/5 in bilateral shoulders, elbow flexor and extensors, wrist flexor and extensors, and intrinsic hand muscles. 5/5 bilateral lower extremity hip flexors, extensors, knee flexors and extensors, and ankle dorsi and plantar flexors.    Skin: Skin is warm and dry. No rash noted.  Psychiatric: She has a normal mood and affect. Her behavior is normal. Judgment and thought content normal.    ED Course  Procedures (including critical care time) Labs Review Labs Reviewed  CBC WITH DIFFERENTIAL - Abnormal; Notable for the following:    RDW 11.4 (*)    All other components within normal limits  COMPREHENSIVE METABOLIC PANEL - Abnormal; Notable for the following:    ALT 82 (*)    GFR calc non Af Amer 86 (*)    All other components within normal limits   Imaging Review Ct Head Wo Contrast  05/16/2013   CLINICAL DATA:  Headache  EXAM: CT HEAD WITHOUT CONTRAST  TECHNIQUE: Contiguous axial images were obtained from the base  of the skull through the vertex without intravenous contrast.  COMPARISON:  None.  FINDINGS: There is no evidence of intra-axial or extra-axial fluid collections, nor acute hemorrhage. The ventricles and cisterns are patent. There is no evidence of mass effect. The cerebellum, pons, and basal ganglia regions are unremarkable. There is no evidence of a depressed skull fracture. The visualized paranasal sinuses and mastoid air cells are patent.  IMPRESSION: No evidence of focal or acute intracranial abnormalities.   Electronically Signed   By: Salome Holmes M.D.   On: 05/16/2013 16:40    EKG Interpretation   None  MDM  No diagnosis found. Patient with chronic headaches worsened over the past several weeks. She has not any signs or symptoms of infection or mass. She had been seen by her primary care physician told that her liver enzymes have been elevated but has not taken acetaminophen or over-the-counter Aleve which taken in the past. I rechecked her liver function tests and her ALT remains slightly elevated at 82 AST and alkaline phosphatase are normal. She is advised that she can take the Aleve to stay away from acetaminophen. She is advised to followup with her primary care physician for further evaluation of her headaches persist. The headache has almost completely resolved here with 1 mg of ibutilide.   Hilario Quarry, MD 05/16/13 [phone]

## 2014-05-01 ENCOUNTER — Encounter (HOSPITAL_BASED_OUTPATIENT_CLINIC_OR_DEPARTMENT_OTHER): Payer: Self-pay | Admitting: *Deleted

## 2014-05-01 ENCOUNTER — Emergency Department (HOSPITAL_BASED_OUTPATIENT_CLINIC_OR_DEPARTMENT_OTHER)
Admission: EM | Admit: 2014-05-01 | Discharge: 2014-05-01 | Disposition: A | Discharge status: Home / Self Care | Payer: BC Managed Care – PPO | Attending: Emergency Medicine | Admitting: Emergency Medicine

## 2014-05-01 DIAGNOSIS — M79622 Pain in left upper arm: Secondary | ICD-10-CM | POA: Diagnosis not present

## 2014-05-01 DIAGNOSIS — R531 Weakness: Secondary | ICD-10-CM

## 2014-05-01 DIAGNOSIS — Z79899 Other long term (current) drug therapy: Secondary | ICD-10-CM | POA: Diagnosis not present

## 2014-05-01 DIAGNOSIS — M25521 Pain in right elbow: Secondary | ICD-10-CM | POA: Insufficient documentation

## 2014-05-01 DIAGNOSIS — M5412 Radiculopathy, cervical region: Secondary | ICD-10-CM | POA: Diagnosis not present

## 2014-05-01 DIAGNOSIS — Z8739 Personal history of other diseases of the musculoskeletal system and connective tissue: Secondary | ICD-10-CM | POA: Insufficient documentation

## 2014-05-01 DIAGNOSIS — Z791 Long term (current) use of non-steroidal anti-inflammatories (NSAID): Secondary | ICD-10-CM | POA: Diagnosis not present

## 2014-05-01 DIAGNOSIS — R202 Paresthesia of skin: Secondary | ICD-10-CM | POA: Insufficient documentation

## 2014-05-01 LAB — URINALYSIS, ROUTINE W REFLEX MICROSCOPIC
BILIRUBIN URINE: NEGATIVE
GLUCOSE, UA: NEGATIVE mg/dL
HGB URINE DIPSTICK: NEGATIVE
KETONES UR: NEGATIVE mg/dL
Leukocytes, UA: NEGATIVE
Nitrite: NEGATIVE
PH: 6.5 (ref 5.0–8.0)
Protein, ur: NEGATIVE mg/dL
SPECIFIC GRAVITY, URINE: 1.016 (ref 1.005–1.030)
Urobilinogen, UA: 0.2 mg/dL (ref 0.0–1.0)

## 2014-05-01 LAB — COMPREHENSIVE METABOLIC PANEL
ALBUMIN: 3.9 g/dL (ref 3.5–5.2)
ALK PHOS: 70 U/L (ref 39–117)
ALT: 46 U/L — ABNORMAL HIGH (ref 0–35)
ANION GAP: 12 (ref 5–15)
AST: 27 U/L (ref 0–37)
BUN: 15 mg/dL (ref 6–23)
CO2: 25 mEq/L (ref 19–32)
CREATININE: 0.8 mg/dL (ref 0.50–1.10)
Calcium: 9.2 mg/dL (ref 8.4–10.5)
Chloride: 103 mEq/L (ref 96–112)
GFR calc non Af Amer: 86 mL/min — ABNORMAL LOW (ref >90)
GLUCOSE: 98 mg/dL (ref 70–99)
POTASSIUM: 4 meq/L (ref 3.7–5.3)
Sodium: 140 mEq/L (ref 137–147)
TOTAL PROTEIN: 7.1 g/dL (ref 6.0–8.3)

## 2014-05-01 LAB — CBC WITH DIFFERENTIAL/PLATELET
BASOS PCT: 0 % (ref 0–1)
Basophils Absolute: 0 10*3/uL (ref 0.0–0.1)
EOS ABS: 0.2 10*3/uL (ref 0.0–0.7)
Eosinophils Relative: 3 % (ref 0–5)
HEMATOCRIT: 38.3 % (ref 36.0–46.0)
HEMOGLOBIN: 13 g/dL (ref 12.0–15.0)
LYMPHS ABS: 2.5 10*3/uL (ref 0.7–4.0)
Lymphocytes Relative: 33 % (ref 12–46)
MCH: 31 pg (ref 26.0–34.0)
MCHC: 33.9 g/dL (ref 30.0–36.0)
MCV: 91.2 fL (ref 78.0–100.0)
MONO ABS: 0.6 10*3/uL (ref 0.1–1.0)
MONOS PCT: 9 % (ref 3–12)
NEUTROS PCT: 55 % (ref 43–77)
Neutro Abs: 4.2 10*3/uL (ref 1.7–7.7)
Platelets: 332 10*3/uL (ref 150–400)
RBC: 4.2 MIL/uL (ref 3.87–5.11)
RDW: 11.3 % — ABNORMAL LOW (ref 11.5–15.5)
WBC: 7.6 10*3/uL (ref 4.0–10.5)

## 2014-05-01 LAB — TSH: TSH: 0.763 u[IU]/mL (ref 0.350–4.500)

## 2014-05-01 LAB — T4, FREE: Free T4: 1.28 ng/dL (ref 0.80–1.80)

## 2014-05-01 MED ORDER — TRAMADOL HCL 50 MG PO TABS
50.0000 mg | ORAL_TABLET | Freq: Four times a day (QID) | ORAL | Status: DC | PRN
Start: 2014-05-01 — End: 2014-05-21

## 2014-05-01 MED ORDER — PREDNISONE 50 MG PO TABS: 50.0000 mg | ORAL_TABLET | Freq: Every day | ORAL | Status: DC

## 2014-05-01 MED ORDER — MELOXICAM 15 MG PO TABS: 15.0000 mg | ORAL_TABLET | Freq: Every day | ORAL | Status: DC

## 2014-05-01 NOTE — Discharge Instructions (Signed)
Take mobic for pain and inflammation. Take ultram for severe pain. Prednisone for numbness and pain in left arm. Take as prescribed until all gone. Please follow up with primary care doctor as referred and with your neurosurgeon or sports medicine specialist for further evaluation. Your blood work today is normal, however, not all blood tests are back. Return if worsening symptoms.   Cervical Radiculopathy Cervical radiculopathy happens when a nerve in the neck is pinched or bruised by a slipped (herniated) disk or by arthritic changes in the bones of the cervical spine. This can occur due to an injury or as part of the normal aging process. Pressure on the cervical nerves can cause pain or numbness that runs from your neck all the way down into your arm and fingers. CAUSES  There are many possible causes, including:  Injury.  Muscle tightness in the neck from overuse.  Swollen, painful joints (arthritis).  Breakdown or degeneration in the bones and joints of the spine (spondylosis) due to aging.  Bone spurs that may develop near the cervical nerves. SYMPTOMS  Symptoms include pain, weakness, or numbness in the affected arm and hand. Pain can be severe or irritating. Symptoms may be worse when extending or turning the neck. DIAGNOSIS  Your caregiver will ask about your symptoms and do a physical exam. He or she may test your strength and reflexes. X-rays, CT scans, and MRI scans may be needed in cases of injury or if the symptoms do not go away after a period of time. Electromyography (EMG) or nerve conduction testing may be done to study how your nerves and muscles are working. TREATMENT  Your caregiver may recommend certain exercises to help relieve your symptoms. Cervical radiculopathy can, and often does, get better with time and treatment. If your problems continue, treatment options may include:  Wearing a soft collar for short periods of time.  Physical therapy to strengthen the neck  muscles.  Medicines, such as nonsteroidal anti-inflammatory drugs (NSAIDs), oral corticosteroids, or spinal injections.  Surgery. Different types of surgery may be done depending on the cause of your problems. HOME CARE INSTRUCTIONS   Put ice on the affected area.  Put ice in a plastic bag.  Place a towel between your skin and the bag.  Leave the ice on for 15-20 minutes, 03-04 times a day or as directed by your caregiver.  If ice does not help, you can try using heat. Take a warm shower or bath, or use a hot water bottle as directed by your caregiver.  You may try a gentle neck and shoulder massage.  Use a flat pillow when you sleep.  Only take over-the-counter or prescription medicines for pain, discomfort, or fever as directed by your caregiver.  If physical therapy was prescribed, follow your caregiver's directions.  If a soft collar was prescribed, use it as directed. SEEK IMMEDIATE MEDICAL CARE IF:   Your pain gets much worse and cannot be controlled with medicines.  You have weakness or numbness in your hand, arm, face, or leg.  You have a high fever or a stiff, rigid neck.  You lose bowel or bladder control (incontinence).  You have trouble with walking, balance, or speaking. MAKE SURE YOU:   Understand these instructions.  Will watch your condition.  Will get help right away if you are not doing well or get worse. Document Released: 01/26/2001 Document Revised: 07/26/2011 Document Reviewed: 12/15/2010 [facility]Care Patient Information 2015 [place], [place]. This information is not intended to replace  advice given to you by your health care provider. Make sure you discuss any questions you have with your health care provider.   Weakness Weakness is a lack of strength. It may be felt all over the body (generalized) or in one specific part of the body (focal). Some causes of weakness can be serious. You may need further medical evaluation, especially if you are  elderly or you have a history of immunosuppression (such as chemotherapy or HIV), kidney disease, heart disease, or diabetes. CAUSES  Weakness can be caused by many different things, including:  Infection.  Physical exhaustion.  Internal bleeding or other blood loss that results in a lack of red blood cells (anemia).  Dehydration. This cause is more common in elderly people.  Side effects or electrolyte abnormalities from medicines, such as pain medicines or sedatives.  Emotional distress, anxiety, or depression.  Circulation problems, especially severe peripheral arterial disease.  Heart disease, such as rapid atrial fibrillation, bradycardia, or heart failure.  Nervous system disorders, such as Guillain-Barr syndrome, multiple sclerosis, or stroke. DIAGNOSIS  To find the cause of your weakness, your caregiver will take your history and perform a physical exam. Lab tests or X-rays may also be ordered, if needed. TREATMENT  Treatment of weakness depends on the cause of your symptoms and can vary greatly. HOME CARE INSTRUCTIONS   Rest as needed.  Eat a well-balanced diet.  Try to get some exercise every day.  Only take over-the-counter or prescription medicines as directed by your caregiver. SEEK MEDICAL CARE IF:   Your weakness seems to be getting worse or spreads to other parts of your body.  You develop new aches or pains. SEEK IMMEDIATE MEDICAL CARE IF:   You cannot perform your normal daily activities, such as getting dressed and feeding yourself.  You cannot walk up and down stairs, or you feel exhausted when you do so.  You have shortness of breath or chest pain.  You have difficulty moving parts of your body.  You have weakness in only one area of the body or on only one side of the body.  You have a fever.  You have trouble speaking or swallowing.  You cannot control your bladder or bowel movements.  You have black or bloody vomit or stools. MAKE  SURE YOU:  Understand these instructions.  Will watch your condition.  Will get help right away if you are not doing well or get worse. Document Released: 05/03/2005 Document Revised: 11/02/2011 Document Reviewed: 07/02/2011 [facility] Patient Information 2015 [place], [place]LL[place]. This information is not intended to replace advice given to you by your health care provider. Make sure you discuss any questions you have with your health care provider.

## 2014-05-01 NOTE — ED Notes (Signed)
MD at bedside. 

## 2014-05-01 NOTE — ED Notes (Signed)
Pt c/o bicep pain with left index finger numbness, denies fall also c/o generalized weakness fatigue.

## 2014-05-01 NOTE — ED Provider Notes (Signed)
CSN: [id number]     Arrival date & time 05/01/14  1551 History   First MD Initiated Contact with Patient 05/01/14 1605     Chief Complaint  Patient presents with  . Weakness     (Consider location/radiation/quality/duration/timing/severity/associated sxs/prior Treatment) HPI Karen BjorkMelissa B Gregory is a 48 y.o. female with no medical problems presents to ED complaining of left arm pain for several months, left finger numbness for several weeks, generalized weakness, fatigue, swelling of upper and lower extremities. 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 She states she is very active, exercises regularly, eats well, drinks plenty of fluids. Also admits to a lot of stress over the last several years. States she is becoming more concerned because now unable to feel anything with tips of 1st, 2nd, 3rd fingers, and having bilateral grip strength weakness to where she is unable to open bottles on her own. She denies headaches. No neck pain or recent re injuries. No chest pain or abdominal pain. No fever, chills, malaise.   Past Medical History  Diagnosis Date  . Impingement syndrome of right shoulder    Past Surgical History  Procedure Laterality Date  . Tonsillectomy  age 48  . Vaginal hysterectomy  03-09-2000    W/ LEFT SALPINGOOPHECTOMY   History reviewed. No pertinent family history. History  Substance Use Topics  . Smoking status: Never Smoker   . Smokeless tobacco: Never Used  . Alcohol Use: No   OB History    No data available     Review of Systems  Constitutional: Positive for fatigue. Negative for fever and chills.  Respiratory: Negative for cough, chest tightness and shortness of breath.   Cardiovascular: Positive for leg swelling. Negative for chest pain and palpitations.  Gastrointestinal: Negative for nausea, vomiting, abdominal pain and diarrhea.   Genitourinary: Negative for dysuria, flank pain and pelvic pain.  Musculoskeletal: Positive for myalgias and arthralgias. Negative for neck pain and neck stiffness.  Skin: Negative for rash.  Neurological: Positive for weakness and numbness. Negative for dizziness and headaches.  All other systems reviewed and are negative.     Allergies  Review of patient's allergies indicates no known allergies.  Home Medications   Prior to Admission medications   Medication Sig Start Date End Date Taking? Authorizing Provider  naproxen sodium (ANAPROX) 220 MG tablet Take 220 mg by mouth 2 (two) times daily with a meal.    Historical Provider, MD   BP 152/92 mmHg  Pulse 87  Temp(Src) 97.9 F (36.6 C)  Resp 16  Ht 5\' 2"  (1.575 m)  Wt 181 lb (82.101 kg)  BMI 33.10 kg/m2  SpO2 100% Physical Exam  Constitutional: She is oriented to person, place, and time. She appears well-developed and well-nourished. No distress.  HENT:  Head: Normocephalic.  Eyes: Conjunctivae are normal.  Neck: Normal range of motion. Neck supple.  Cardiovascular: Normal rate, regular rhythm and normal heart sounds.   Pulmonary/Chest: Effort normal and breath sounds normal. No respiratory distress. She has no wheezes. She has no rales.  Abdominal: Soft. Bowel sounds are normal. She exhibits no distension. There is no tenderness. There is no rebound.  Musculoskeletal: She exhibits no edema.  Neurological: She is alert and oriented to person, place, and time.  Decreased sensation to the dorsal fingers 1, 2, 3 of left hand. Unable to feel sharp touch to the palmar  surface of the fingers 1, 2, 3. Decreased grip strength bilaterally  Skin: Skin is warm and dry.  Psychiatric: She has a normal mood and affect. Her behavior is normal.  Nursing note and vitals reviewed.   ED Course  Procedures (including critical care time) Labs Review Labs Reviewed  CBC WITH DIFFERENTIAL - Abnormal; Notable for the following:    RDW 11.3  (*)    All other components within normal limits  COMPREHENSIVE METABOLIC PANEL - Abnormal; Notable for the following:    ALT 46 (*)    Total Bilirubin <0.2 (*)    GFR calc non Af Amer 86 (*)    All other components within normal limits  URINALYSIS, ROUTINE W REFLEX MICROSCOPIC  TSH  T4, FREE    Imaging Review No results found.   EKG Interpretation None      MDM   Final diagnoses:  Left upper arm pain  Cervical radiculopathy  Left hand paresthesia  Generalized weakness    Patient with left upper arm pain, numbness sensation to the first 3 fingers. Also complaining of right elbow pain. Generalized fatigue, malaise, swelling of extremities, weakness. Vital signs are normal except for mildly elevated blood pressure of 152/92. She is afebrile. Nontoxic appearing. On exam she does have decreased sensation in her left fingers 1 through 3. She has bilaterally weakened grip strength. She has pain with range of motion of the left shoulder, tenderness in the upper arm with no evidence of cellulitis or infection. 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 Her symptoms are most likely a result of cervical radiculopathy. Lab work obtained to evaluate her generalized weakness and is unremarkable including CBC, metabolic panel, TSH and free T4. Discuss with Dr. Rosalia Hammersay. Will need a close follow-up with primary care doctor. Patient is looking for a new doctor this time. Will get some referrals. Instructed to follow-up with her neurosurgeon regarding her degenerative cervical spine disease, she stated she did not want to see the same neurosurgeon. Will sent to sports medicine, instructed to have her be referred by primary care doctor. Patient voiced understanding. Nontoxic appearing and stable for discharge at this time.  Filed Vitals:   05/01/14 1556 05/01/14 1845  BP: 152/92 144/93  Pulse: 87 61  Temp: 97.9 F (36.6 C) 98.5 F (36.9 C)  TempSrc:  Oral  Resp: 16 16  Height:  5\' 2"  (1.575 m)   Weight: 181 lb (82.101 kg)   SpO2: 100% 97%     Lottie Musselatyana A Fares Ramthun, PA-C 05/01/14 2249  Hilario Quarryanielle S Ray, MD 05/02/14 [phone]

## 2014-05-21 ENCOUNTER — Ambulatory Visit (INDEPENDENT_AMBULATORY_CARE_PROVIDER_SITE_OTHER): Payer: BC Managed Care – PPO | Admitting: Physician Assistant

## 2014-05-21 ENCOUNTER — Encounter: Payer: Self-pay | Admitting: Physician Assistant

## 2014-05-21 ENCOUNTER — Ambulatory Visit (HOSPITAL_BASED_OUTPATIENT_CLINIC_OR_DEPARTMENT_OTHER)
Admission: RE | Admit: 2014-05-21 | Discharge: 2014-05-21 | Disposition: A | Discharge status: Home / Self Care | Payer: BLUE CROSS/BLUE SHIELD | Source: Ambulatory Visit | Attending: Physician Assistant | Admitting: Physician Assistant

## 2014-05-21 VITALS — BP 164/97 | HR 86 | Temp 97.9°F | Resp 16 | Ht 62.0 in | Wt 181.1 lb

## 2014-05-21 DIAGNOSIS — M5410 Radiculopathy, site unspecified: Secondary | ICD-10-CM | POA: Diagnosis present

## 2014-05-21 DIAGNOSIS — M25521 Pain in right elbow: Secondary | ICD-10-CM

## 2014-05-21 DIAGNOSIS — M4722 Other spondylosis with radiculopathy, cervical region: Secondary | ICD-10-CM | POA: Insufficient documentation

## 2014-05-21 DIAGNOSIS — M5010 Cervical disc disorder with radiculopathy, unspecified cervical region: Secondary | ICD-10-CM

## 2014-05-21 MED ORDER — HYDROCODONE-ACETAMINOPHEN 10-325 MG PO TABS: 1.0000 | ORAL_TABLET | Freq: Three times a day (TID) | ORAL | Status: DC | PRN

## 2014-05-21 MED ORDER — GABAPENTIN 100 MG PO CAPS: 100.0000 mg | ORAL_CAPSULE | Freq: Three times a day (TID) | ORAL | Status: DC

## 2014-05-21 NOTE — Progress Notes (Signed)
Patient presents to clinic today to establish care.  Acute Concerns: Patient with history of cervical disc disease who presents to clinic complaining of chronic and worsening bilateral neck pain, radiating into upper extremities, now with weakness and numbness of extremities.  Has recently moved to area and is need of new specialist to manage symptoms.  Has not had MRI in several years. Patient has taken OTC pain medications with little relief in symptoms.  Is in the process of getting her previous records sent to our office.  Past Medical History  Diagnosis Date  . Impingement syndrome of right shoulder   . Cervical disc disease   . Elevated blood pressure (not hypertension)     Past Surgical History  Procedure Laterality Date  . Tonsillectomy  age 22  . Vaginal hysterectomy  03-09-2000    W/ LEFT SALPINGOOPHECTOMY  . Shoulder surgery  2013    Right, Bursa    Current Outpatient Prescriptions on File Prior to Visit  Medication Sig Dispense Refill  . meloxicam (MOBIC) 15 MG tablet Take 1 tablet (15 mg total) by mouth daily. 20 tablet 0  . naproxen sodium (ANAPROX) 220 MG tablet Take 220 mg by mouth 2 (two) times daily as needed.     . traMADol (ULTRAM) 50 MG tablet Take 1 tablet (50 mg total) by mouth every 6 (six) hours as needed. 20 tablet 0   No current facility-administered medications on file prior to visit.    No Known Allergies  Family History  Problem Relation Age of Onset  . Heart disease Father     Living  . Fibromyalgia Mother   . Allergies Brother   . Healthy Son     x2  . Polycystic ovary syndrome Daughter     x1    History   Social History  . Marital Status: Divorced    Spouse Name: N/A    Number of Children: N/A  . Years of Education: N/A   Occupational History  . Not on file.   Social History Main Topics  . Smoking status: Never Smoker   . Smokeless tobacco: Never Used  . Alcohol Use: No  . Drug Use: No  . Sexual Activity: Yes    Birth  Control/ Protection: Surgical   Other Topics Concern  . Not on file   Social History Narrative   ROS Pertinent ROS are listed in the HPI.  BP 164/97 mmHg  Pulse 86  Temp(Src) 97.9 F (36.6 C) (Oral)  Resp 16  Ht '5\' 2"'  (1.575 m)  Wt 181 lb 2 oz (82.158 kg)  BMI 33.12 kg/m2  SpO2 98%  Physical Exam  Constitutional: She is oriented to person, place, and time and well-developed, well-nourished, and in no distress.  HENT:  Head: Normocephalic and atraumatic.  Eyes: Conjunctivae are normal. Pupils are equal, round, and reactive to light.  Neck: Neck supple. No thyromegaly present.  Cardiovascular: Normal rate, regular rhythm, normal heart sounds and intact distal pulses.   Pulmonary/Chest: Effort normal and breath sounds normal. No respiratory distress. She has no wheezes. She has no rales. She exhibits no tenderness.  Musculoskeletal:       Right shoulder: She exhibits decreased strength.       Left shoulder: She exhibits decreased strength.       Right elbow: She exhibits normal range of motion. No tenderness found.       Left elbow: She exhibits normal range of motion. No tenderness found.  Cervical back: She exhibits tenderness. She exhibits no bony tenderness.  Lymphadenopathy:    She has no cervical adenopathy.  Neurological: She is alert and oriented to person, place, and time.  Skin: Skin is warm and dry. No rash noted.  Vitals reviewed.   Recent Results (from the past 2160 hour(s))  Urinalysis, Routine w reflex microscopic     Status: None   Collection Time: 05/01/14  4:09 PM  Result Value Ref Range   Color, Urine YELLOW YELLOW   APPearance CLEAR CLEAR   Specific Gravity, Urine 1.016 1.005 - 1.030   pH 6.5 5.0 - 8.0   Glucose, UA NEGATIVE NEGATIVE mg/dL   Hgb urine dipstick NEGATIVE NEGATIVE   Bilirubin Urine NEGATIVE NEGATIVE   Ketones, ur NEGATIVE NEGATIVE mg/dL   Protein, ur NEGATIVE NEGATIVE mg/dL   Urobilinogen, UA 0.2 0.0 - 1.0 mg/dL   Nitrite  NEGATIVE NEGATIVE   Leukocytes, UA NEGATIVE NEGATIVE    Comment: MICROSCOPIC NOT DONE ON URINES WITH NEGATIVE PROTEIN, BLOOD, LEUKOCYTES, NITRITE, OR GLUCOSE <1000 mg/dL.  CBC with Differential     Status: Abnormal   Collection Time: 05/01/14  4:45 PM  Result Value Ref Range   WBC 7.6 4.0 - 10.5 K/uL   RBC 4.20 3.87 - 5.11 MIL/uL   Hemoglobin 13.0 12.0 - 15.0 g/dL   HCT 38.3 36.0 - 46.0 %   MCV 91.2 78.0 - 100.0 fL   MCH 31.0 26.0 - 34.0 pg   MCHC 33.9 30.0 - 36.0 g/dL   RDW 11.3 (L) 11.5 - 15.5 %   Platelets 332 150 - 400 K/uL   Neutrophils Relative % 55 43 - 77 %   Neutro Abs 4.2 1.7 - 7.7 K/uL   Lymphocytes Relative 33 12 - 46 %   Lymphs Abs 2.5 0.7 - 4.0 K/uL   Monocytes Relative 9 3 - 12 %   Monocytes Absolute 0.6 0.1 - 1.0 K/uL   Eosinophils Relative 3 0 - 5 %   Eosinophils Absolute 0.2 0.0 - 0.7 K/uL   Basophils Relative 0 0 - 1 %   Basophils Absolute 0.0 0.0 - 0.1 K/uL  Comprehensive metabolic panel     Status: Abnormal   Collection Time: 05/01/14  4:45 PM  Result Value Ref Range   Sodium 140 137 - 147 mEq/L   Potassium 4.0 3.7 - 5.3 mEq/L   Chloride 103 96 - 112 mEq/L   CO2 25 19 - 32 mEq/L   Glucose, Bld 98 70 - 99 mg/dL   BUN 15 6 - 23 mg/dL   Creatinine, Ser 0.80 0.50 - 1.10 mg/dL   Calcium 9.2 8.4 - 10.5 mg/dL   Total Protein 7.1 6.0 - 8.3 g/dL   Albumin 3.9 3.5 - 5.2 g/dL   AST 27 0 - 37 U/L   ALT 46 (H) 0 - 35 U/L   Alkaline Phosphatase 70 39 - 117 U/L   Total Bilirubin <0.2 (L) 0.3 - 1.2 mg/dL   GFR calc non Af Amer 86 (L) >90 mL/min   GFR calc Af Amer >90 >90 mL/min    Comment: (NOTE) The eGFR has been calculated using the CKD EPI equation. This calculation has not been validated in all clinical situations. eGFR's persistently <90 mL/min signify possible Chronic Kidney Disease.    Anion gap 12 5 - 15  T4, free     Status: None   Collection Time: 05/01/14  4:45 PM  Result Value Ref Range   Free T4 1.28 0.80 -  1.80 ng/dL    Comment: Performed at  [facility]  TSH     Status: None   Collection Time: 05/01/14  4:47 PM  Result Value Ref Range   TSH 0.763 0.350 - 4.500 uIU/mL    Comment: Performed at [facility]    Assessment/Plan: No problem-specific assessment & plan notes found for this encounter.

## 2014-05-21 NOTE — Patient Instructions (Addendum)
Please go downstairs for imaging. I will call you with your results.  Please take the hydrocodone as directed if needed for severe pain. Start taking the Gabapentin as directed -- 1 tablet by mouth daily for 3 days.  Then increase to 1 tablet by mouth twice daily.    One I have your x-ray results, we will proceed with MRI and referral to Neurosurgery.  Follow-up with me in 2 weeks.

## 2014-05-21 NOTE — Progress Notes (Signed)
Pre visit review using our clinic review tool, if applicable. No additional management support is needed unless otherwise documented below in the visit note/SLS  

## 2014-05-22 ENCOUNTER — Telehealth: Payer: Self-pay | Admitting: Physician Assistant

## 2014-05-22 DIAGNOSIS — M5010 Cervical disc disorder with radiculopathy, unspecified cervical region: Secondary | ICD-10-CM

## 2014-05-22 NOTE — Telephone Encounter (Signed)
X-ray of the elbow negative for acute findings.  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  This indicates that there are narrowed openings where the nerves exit the spine on both sides that is likely causing nerve compression and the bulk of her symptoms.  As discussed, a referral to Neurosurgery [facility]) has been placed.  I have also ordered an MRI.  She will be contacted to schedule.

## 2014-05-22 NOTE — Telephone Encounter (Signed)
Patient informed, understood & agreed/SLS  

## 2014-05-25 ENCOUNTER — Ambulatory Visit (HOSPITAL_BASED_OUTPATIENT_CLINIC_OR_DEPARTMENT_OTHER)
Admission: RE | Admit: 2014-05-25 | Discharge: 2014-05-25 | Disposition: A | Discharge status: Home / Self Care | Payer: BLUE CROSS/BLUE SHIELD | Source: Ambulatory Visit | Attending: Physician Assistant | Admitting: Physician Assistant

## 2014-05-25 DIAGNOSIS — M4802 Spinal stenosis, cervical region: Secondary | ICD-10-CM | POA: Insufficient documentation

## 2014-05-25 DIAGNOSIS — M542 Cervicalgia: Secondary | ICD-10-CM | POA: Insufficient documentation

## 2014-05-25 DIAGNOSIS — M5022 Other cervical disc displacement, mid-cervical region: Secondary | ICD-10-CM | POA: Insufficient documentation

## 2014-05-25 DIAGNOSIS — M79602 Pain in left arm: Secondary | ICD-10-CM | POA: Diagnosis not present

## 2014-05-25 DIAGNOSIS — M5010 Cervical disc disorder with radiculopathy, unspecified cervical region: Secondary | ICD-10-CM

## 2014-05-25 DIAGNOSIS — R200 Anesthesia of skin: Secondary | ICD-10-CM | POA: Insufficient documentation

## 2014-05-27 DIAGNOSIS — M5010 Cervical disc disorder with radiculopathy, unspecified cervical region: Secondary | ICD-10-CM | POA: Insufficient documentation

## 2014-05-27 NOTE — Assessment & Plan Note (Signed)
Will proceed with MRI and referral to Neurosurgery.,  Will begin Norco and Gabapentin for pain control.  Reasonable to continue daily Mobic.  Follow-up in 2 weeks.

## 2014-05-29 ENCOUNTER — Telehealth: Payer: Self-pay | Admitting: Physician Assistant

## 2014-05-29 NOTE — Telephone Encounter (Signed)
Caller name: Dillon BjorkWeaver, [place] B Relation to pt: self  Call back number: [phone]  Reason for call:  Pt returning Jasmine December(Sharon CMA) call regarding MRI results pt states she has not spoken with neuro office. Please advise

## 2014-05-30 NOTE — Telephone Encounter (Signed)
Spoke with patient and advised per recommendations. Patient states that the Gabapentin has not helped. She states that she feels worse. Please advise.

## 2014-05-30 NOTE — Telephone Encounter (Signed)
Spoke with patient who states that she is taking the Hydrocodone at night which helps but she cannot take it during the day. She says that she can push through it for now and does not want additional pain med.  [facility] including report to neuro, awaiting response.

## 2014-05-30 NOTE — Telephone Encounter (Signed)
Can we please see about expediting her Neuro appointment.  I can write her pain medication to help but will not be able to until I return to office tomorrow.  Please inform patient of this.

## 2014-06-05 ENCOUNTER — Encounter: Payer: Self-pay | Admitting: Physician Assistant

## 2014-06-05 ENCOUNTER — Ambulatory Visit (INDEPENDENT_AMBULATORY_CARE_PROVIDER_SITE_OTHER): Payer: BLUE CROSS/BLUE SHIELD | Admitting: Physician Assistant

## 2014-06-05 VITALS — BP 143/90 | HR 80 | Temp 98.0°F | Resp 16 | Ht 62.0 in | Wt 183.0 lb

## 2014-06-05 DIAGNOSIS — M5010 Cervical disc disorder with radiculopathy, unspecified cervical region: Secondary | ICD-10-CM

## 2014-06-05 MED ORDER — PREGABALIN 50 MG PO CAPS: 50.0000 mg | ORAL_CAPSULE | Freq: Three times a day (TID) | ORAL | Status: DC

## 2014-06-05 NOTE — Progress Notes (Signed)
Pre visit review using our clinic review tool, if applicable. No additional management support is needed unless otherwise documented below in the visit note/SLS  

## 2014-06-05 NOTE — Patient Instructions (Addendum)
Please take the Norco as directed.  Start the Lyrica once daily for 3 days.  Then increase to twice daily.  After a week you can go up to three times daily.   You will receive a call from Neurosurgery within 24 hours for an appointment.

## 2014-06-09 NOTE — Assessment & Plan Note (Signed)
Patient experiencing excessive daytime somnolence with use of gabapentin. We'll DC that medication. We'll attempt trial of Lyrica 50 mg. We'll start with daily dosing and titrate up to 3 times daily. Patient encouraged to use the Norco as needed, especially at bedtime to help facilitate sleep and relieve pain. Status of neurosurgery referral checked on today. Patient requested Dr. Danielle DessElsner, but his first available appointment was over 1 month. Patient is now amenable to seeing one of Dr. Verlee RossettiElsner's peers. Patient will be contacted for appointment.

## 2014-06-09 NOTE — Progress Notes (Signed)
Patient presents to clinic today for follow-up of cervical disc disorder with radiculopathy. MRI revealed disc herniation at multiple levels in the cervical spine, contributing to all of her symptoms. Patient was placed on regimen of hydrocodone and gabapentin for nerve pain. Patient was to titrate up to gabapentin 3 times daily, reports she has only been able to take at bedtime due to the medication making her excessively sleepy. Patient states the Norco helps, but she only takes it once every few days. She "does not like medicine" endorses continued weakness of upper extremities bilaterally. Notes numbness has worsened. Patient is still waiting for appointment with neurosurgeon.  Past Medical History  Diagnosis Date  . Impingement syndrome of right shoulder   . Cervical disc disease   . Elevated blood pressure (not hypertension)     Current Outpatient Prescriptions on File Prior to Visit  Medication Sig Dispense Refill  . HYDROcodone-acetaminophen (NORCO) 10-325 MG per tablet Take 1 tablet by mouth every 8 (eight) hours as needed. 90 tablet 0  . naproxen sodium (ANAPROX) 220 MG tablet Take 220 mg by mouth 2 (two) times daily as needed.     . Nutritional Supplements (JUICE PLUS FIBRE PO) Take 2 capsules by mouth daily. 2 Berries, 2 Vegs, 2 Fruits     No current facility-administered medications on file prior to visit.    No Known Allergies  Family History  Problem Relation Age of Onset  . Heart disease Father     Living  . Fibromyalgia Mother   . Allergies Brother   . Healthy Son     x2  . Polycystic ovary syndrome Daughter     x1    History   Social History  . Marital Status: Divorced    Spouse Name: N/A    Number of Children: N/A  . Years of Education: N/A   Occupational History  .      Development worker, [employer]   Social History Main Topics  . Smoking status: Never Smoker   . Smokeless tobacco: Never Used  . Alcohol Use: 0.0 oz/week    0 Not specified per  week     Comment: rare  . Drug Use: No  . Sexual Activity: Yes    Birth Control/ Protection: Surgical   Other Topics Concern  . None   Social History Narrative   Review of Systems - See HPI.  All other ROS are negative.  BP 143/90 mmHg  Pulse 80  Temp(Src) 98 F (36.7 C) (Oral)  Resp 16  Ht 5' 2" (1.575 m)  Wt 183 lb (83.008 kg)  BMI 33.46 kg/m2  SpO2 100%  Physical Exam  Constitutional: She is oriented to person, place, and time and well-developed, well-nourished, and in no distress.  HENT:  Head: Normocephalic and atraumatic.  Eyes: Conjunctivae are normal.  Cardiovascular: Normal rate, regular rhythm, normal heart sounds and intact distal pulses.   Pulmonary/Chest: Effort normal and breath sounds normal. No respiratory distress. She has no wheezes. She has no rales. She exhibits no tenderness.  Neurological: She is alert and oriented to person, place, and time.  Skin: Skin is warm and dry. No rash noted.  Psychiatric: Affect normal.  Vitals reviewed.   Recent Results (from the past 2160 hour(s))  Urinalysis, Routine w reflex microscopic     Status: None   Collection Time: 05/01/14  4:09 PM  Result Value Ref Range   Color, Urine YELLOW YELLOW   APPearance CLEAR CLEAR  Specific Gravity, Urine 1.016 1.005 - 1.030   pH 6.5 5.0 - 8.0   Glucose, UA NEGATIVE NEGATIVE mg/dL   Hgb urine dipstick NEGATIVE NEGATIVE   Bilirubin Urine NEGATIVE NEGATIVE   Ketones, ur NEGATIVE NEGATIVE mg/dL   Protein, ur NEGATIVE NEGATIVE mg/dL   Urobilinogen, UA 0.2 0.0 - 1.0 mg/dL   Nitrite NEGATIVE NEGATIVE   Leukocytes, UA NEGATIVE NEGATIVE    Comment: MICROSCOPIC NOT DONE ON URINES WITH NEGATIVE PROTEIN, BLOOD, LEUKOCYTES, NITRITE, OR GLUCOSE <1000 mg/dL.  CBC with Differential     Status: Abnormal   Collection Time: 05/01/14  4:45 PM  Result Value Ref Range   WBC 7.6 4.0 - 10.5 K/uL   RBC 4.20 3.87 - 5.11 MIL/uL   Hemoglobin 13.0 12.0 - 15.0 g/dL   HCT 38.3 36.0 - 46.0 %   MCV  91.2 78.0 - 100.0 fL   MCH 31.0 26.0 - 34.0 pg   MCHC 33.9 30.0 - 36.0 g/dL   RDW 11.3 (L) 11.5 - 15.5 %   Platelets 332 150 - 400 K/uL   Neutrophils Relative % 55 43 - 77 %   Neutro Abs 4.2 1.7 - 7.7 K/uL   Lymphocytes Relative 33 12 - 46 %   Lymphs Abs 2.5 0.7 - 4.0 K/uL   Monocytes Relative 9 3 - 12 %   Monocytes Absolute 0.6 0.1 - 1.0 K/uL   Eosinophils Relative 3 0 - 5 %   Eosinophils Absolute 0.2 0.0 - 0.7 K/uL   Basophils Relative 0 0 - 1 %   Basophils Absolute 0.0 0.0 - 0.1 K/uL  Comprehensive metabolic panel     Status: Abnormal   Collection Time: 05/01/14  4:45 PM  Result Value Ref Range   Sodium 140 137 - 147 mEq/L   Potassium 4.0 3.7 - 5.3 mEq/L   Chloride 103 96 - 112 mEq/L   CO2 25 19 - 32 mEq/L   Glucose, Bld 98 70 - 99 mg/dL   BUN 15 6 - 23 mg/dL   Creatinine, Ser 0.80 0.50 - 1.10 mg/dL   Calcium 9.2 8.4 - 10.5 mg/dL   Total Protein 7.1 6.0 - 8.3 g/dL   Albumin 3.9 3.5 - 5.2 g/dL   AST 27 0 - 37 U/L   ALT 46 (H) 0 - 35 U/L   Alkaline Phosphatase 70 39 - 117 U/L   Total Bilirubin <0.2 (L) 0.3 - 1.2 mg/dL   GFR calc non Af Amer 86 (L) >90 mL/min   GFR calc Af Amer >90 >90 mL/min    Comment: (NOTE) The eGFR has been calculated using the CKD EPI equation. This calculation has not been validated in all clinical situations. eGFR's persistently <90 mL/min signify possible Chronic Kidney Disease.    Anion gap 12 5 - 15  T4, free     Status: None   Collection Time: 05/01/14  4:45 PM  Result Value Ref Range   Free T4 1.28 0.80 - 1.80 ng/dL    Comment: Performed at [facility]  TSH     Status: None   Collection Time: 05/01/14  4:47 PM  Result Value Ref Range   TSH 0.763 0.350 - 4.500 uIU/mL    Comment: Performed at [facility]    Assessment/Plan: Cervical disc disorder with radiculopathy of cervical region Patient experiencing excessive daytime somnolence with use of gabapentin. We'll DC that medication. We'll attempt trial of Lyrica 50 mg.  We'll start with daily dosing and titrate up to   3 times daily. Patient encouraged to use the Norco as needed, especially at bedtime to help facilitate sleep and relieve pain. Status of neurosurgery referral checked on today. Patient requested Dr. Elsner, but his first available appointment was over 1 month. Patient is now amenable to seeing one of Dr. Elsner's peers. Patient will be contacted for appointment.       

## 2014-07-23 HISTORY — PX: NECK SURGERY: SHX720

## 2014-09-09 ENCOUNTER — Telehealth: Payer: Self-pay | Admitting: *Deleted

## 2014-09-09 NOTE — Telephone Encounter (Signed)
Unable to reach patient at time of Pre-Visit Call.  Left message for patient to return call when available.    

## 2014-09-10 ENCOUNTER — Other Ambulatory Visit: Payer: Self-pay | Admitting: Physician Assistant

## 2014-09-10 ENCOUNTER — Ambulatory Visit (INDEPENDENT_AMBULATORY_CARE_PROVIDER_SITE_OTHER): Payer: BLUE CROSS/BLUE SHIELD | Admitting: Physician Assistant

## 2014-09-10 ENCOUNTER — Ambulatory Visit (HOSPITAL_BASED_OUTPATIENT_CLINIC_OR_DEPARTMENT_OTHER)
Admission: RE | Admit: 2014-09-10 | Discharge: 2014-09-10 | Disposition: A | Discharge status: Home / Self Care | Payer: BLUE CROSS/BLUE SHIELD | Source: Ambulatory Visit | Attending: Physician Assistant | Admitting: Physician Assistant

## 2014-09-10 ENCOUNTER — Ambulatory Visit: Payer: BC Managed Care – PPO | Admitting: Internal Medicine

## 2014-09-10 ENCOUNTER — Encounter: Payer: Self-pay | Admitting: Physician Assistant

## 2014-09-10 VITALS — BP 138/88 | HR 76 | Temp 98.0°F | Resp 16 | Ht 62.5 in | Wt 180.0 lb

## 2014-09-10 DIAGNOSIS — Z1231 Encounter for screening mammogram for malignant neoplasm of breast: Secondary | ICD-10-CM

## 2014-09-10 DIAGNOSIS — R635 Abnormal weight gain: Secondary | ICD-10-CM

## 2014-09-10 DIAGNOSIS — K5900 Constipation, unspecified: Secondary | ICD-10-CM | POA: Diagnosis not present

## 2014-09-10 DIAGNOSIS — K5909 Other constipation: Secondary | ICD-10-CM

## 2014-09-10 DIAGNOSIS — Z0000 Encounter for general adult medical examination without abnormal findings: Secondary | ICD-10-CM | POA: Diagnosis not present

## 2014-09-10 DIAGNOSIS — M5010 Cervical disc disorder with radiculopathy, unspecified cervical region: Secondary | ICD-10-CM | POA: Diagnosis not present

## 2014-09-10 DIAGNOSIS — Z1239 Encounter for other screening for malignant neoplasm of breast: Secondary | ICD-10-CM | POA: Insufficient documentation

## 2014-09-10 LAB — HEMOGLOBIN A1C: Hgb A1c MFr Bld: 5.3 % (ref 4.6–6.5)

## 2014-09-10 LAB — URINALYSIS, ROUTINE W REFLEX MICROSCOPIC
BILIRUBIN URINE: NEGATIVE
HGB URINE DIPSTICK: NEGATIVE
KETONES UR: NEGATIVE
Leukocytes, UA: NEGATIVE
Nitrite: NEGATIVE
RBC / HPF: NONE SEEN (ref 0–?)
Specific Gravity, Urine: 1.01 (ref 1.000–1.030)
Total Protein, Urine: NEGATIVE
Urine Glucose: NEGATIVE
Urobilinogen, UA: 0.2 (ref 0.0–1.0)
pH: 6 (ref 5.0–8.0)

## 2014-09-10 LAB — COMPREHENSIVE METABOLIC PANEL
ALK PHOS: 91 U/L (ref 39–117)
ALT: 70 U/L — ABNORMAL HIGH (ref 0–35)
AST: 45 U/L — ABNORMAL HIGH (ref 0–37)
Albumin: 4.4 g/dL (ref 3.5–5.2)
BILIRUBIN TOTAL: 0.4 mg/dL (ref 0.2–1.2)
BUN: 12 mg/dL (ref 6–23)
CO2: 27 mEq/L (ref 19–32)
CREATININE: 0.74 mg/dL (ref 0.40–1.20)
Calcium: 9.5 mg/dL (ref 8.4–10.5)
Chloride: 105 mEq/L (ref 96–112)
GFR: 88.82 mL/min (ref >60.00)
GLUCOSE: 106 mg/dL — AB (ref 70–99)
Potassium: 3.8 mEq/L (ref 3.5–5.1)
Sodium: 137 mEq/L (ref 135–145)
Total Protein: 7.2 g/dL (ref 6.0–8.3)

## 2014-09-10 LAB — CBC
HCT: 40.5 % (ref 36.0–46.0)
HEMOGLOBIN: 14 g/dL (ref 12.0–15.0)
MCHC: 34.5 g/dL (ref 30.0–36.0)
MCV: 88.9 fl (ref 78.0–100.0)
Platelets: 348 10*3/uL (ref 150.0–400.0)
RBC: 4.56 Mil/uL (ref 3.87–5.11)
RDW: 12.2 % (ref 11.5–15.5)
WBC: 5.1 10*3/uL (ref 4.0–10.5)

## 2014-09-10 LAB — T3 UPTAKE

## 2014-09-10 LAB — LIPID PANEL
Cholesterol: 167 mg/dL (ref 0–200)
HDL: 52.6 mg/dL (ref >39.00)
LDL Cholesterol: 102 mg/dL — ABNORMAL HIGH (ref 0–99)
NonHDL: 114.4
TRIGLYCERIDES: 61 mg/dL (ref 0.0–149.0)
Total CHOL/HDL Ratio: 3
VLDL: 12.2 mg/dL (ref 0.0–40.0)

## 2014-09-10 LAB — TSH: TSH: 0.66 u[IU]/mL (ref 0.35–4.50)

## 2014-09-10 LAB — HIGH SENSITIVITY CRP: CRP, High Sensitivity: 1.66 mg/L (ref 0.000–5.000)

## 2014-09-10 LAB — FOLLICLE STIMULATING HORMONE: FSH: 57.4 m[IU]/mL

## 2014-09-10 LAB — T4, FREE: Free T4: 0.85 ng/dL (ref 0.60–1.60)

## 2014-09-10 NOTE — Progress Notes (Signed)
Pre visit review using our clinic review tool, if applicable. No additional management support is needed unless otherwise documented below in the visit note. 

## 2014-09-10 NOTE — Assessment & Plan Note (Signed)
Status post surgical decompression. Doing very well. No residual symptoms. Continue follow-up with surgeon.

## 2014-09-10 NOTE — Assessment & Plan Note (Signed)
With other signs concerning for hypothyroidism. Will obtain lab panel today to include TSH, free T4, T3 uptake, FSH, and high sensitivity CRP. Encouraged continued diet and exercise regimen. We'll treat based on results.

## 2014-09-10 NOTE — Assessment & Plan Note (Signed)
Also with symptoms of fatigue, weight gain and cold intolerance. Will obtain CBC, CMP, CRP, FSH and thyroid panel today to further assess. Supportive measures discussed with patient. Treatment will be based on lab results.

## 2014-09-10 NOTE — Patient Instructions (Signed)
Please go to the lab for blood work. I will call you with your results. You will be contacted for a screening mammogram. Follow-up and treatment will be based on results.  Preventive Care for Adults A healthy lifestyle and preventive care can promote health and wellness. Preventive health guidelines for women include the following key practices.  A routine yearly physical is a good way to check with your health care provider about your health and preventive screening. It is a chance to share any concerns and updates on your health and to receive a thorough exam.  Visit your dentist for a routine exam and preventive care every 6 months. Brush your teeth twice a day and floss once a day. Good oral hygiene prevents tooth decay and gum disease.  The frequency of eye exams is based on your age, health, family medical history, use of contact lenses, and other factors. Follow your health care provider's recommendations for frequency of eye exams.  Eat a healthy diet. Foods like vegetables, fruits, whole grains, low-fat dairy products, and lean protein foods contain the nutrients you need without too many calories. Decrease your intake of foods high in solid fats, added sugars, and salt. Eat the right amount of calories for you.Get information about a proper diet from your health care provider, if necessary.  Regular physical exercise is one of the most important things you can do for your health. Most adults should get at least 150 minutes of moderate-intensity exercise (any activity that increases your heart rate and causes you to sweat) each week. In addition, most adults need muscle-strengthening exercises on 2 or more days a week.  Maintain a healthy weight. The body mass index (BMI) is a screening tool to identify possible weight problems. It provides an estimate of body fat based on height and weight. Your health care provider can find your BMI and can help you achieve or maintain a healthy  weight.For adults 20 years and older:  A BMI below 18.5 is considered underweight.  A BMI of 18.5 to 24.9 is normal.  A BMI of 25 to 29.9 is considered overweight.  A BMI of 30 and above is considered obese.  Maintain normal blood lipids and cholesterol levels by exercising and minimizing your intake of saturated fat. Eat a balanced diet with plenty of fruit and vegetables. Blood tests for lipids and cholesterol should begin at age 15 and be repeated every 5 years. If your lipid or cholesterol levels are high, you are over 50, or you are at high risk for heart disease, you may need your cholesterol levels checked more frequently.Ongoing high lipid and cholesterol levels should be treated with medicines if diet and exercise are not working.  If you smoke, find out from your health care provider how to quit. If you do not use tobacco, do not start.  Lung cancer screening is recommended for adults aged 46-80 years who are at high risk for developing lung cancer because of a history of smoking. A yearly low-dose CT scan of the lungs is recommended for people who have at least a 30-pack-year history of smoking and are a current smoker or have quit within the past 15 years. A pack year of smoking is smoking an average of 1 pack of cigarettes a day for 1 year (for example: 1 pack a day for 30 years or 2 packs a day for 15 years). Yearly screening should continue until the smoker has stopped smoking for at least 15 years. Yearly screening  should be stopped for people who develop a health problem that would prevent them from having lung cancer treatment.  If you are pregnant, do not drink alcohol. If you are breastfeeding, be very cautious about drinking alcohol. If you are not pregnant and choose to drink alcohol, do not have more than 1 drink per day. One drink is considered to be 12 ounces (355 mL) of beer, 5 ounces (148 mL) of wine, or 1.5 ounces (44 mL) of liquor.  Avoid use of street drugs. Do not  share needles with anyone. Ask for help if you need support or instructions about stopping the use of drugs.  High blood pressure causes heart disease and increases the risk of stroke. Your blood pressure should be checked at least every 1 to 2 years. Ongoing high blood pressure should be treated with medicines if weight loss and exercise do not work.  If you are 51-95 years old, ask your health care provider if you should take aspirin to prevent strokes.  Diabetes screening involves taking a blood sample to check your fasting blood sugar level. This should be done once every 3 years, after age 67, if you are within normal weight and without risk factors for diabetes. Testing should be considered at a younger age or be carried out more frequently if you are overweight and have at least 1 risk factor for diabetes.  Breast cancer screening is essential preventive care for women. You should practice "breast self-awareness." This means understanding the normal appearance and feel of your breasts and may include breast self-examination. Any changes detected, no matter how small, should be reported to a health care provider. Women in their 82s and 30s should have a clinical breast exam (CBE) by a health care provider as part of a regular health exam every 1 to 3 years. After age 57, women should have a CBE every year. Starting at age 51, women should consider having a mammogram (breast X-ray test) every year. Women who have a family history of breast cancer should talk to their health care provider about genetic screening. Women at a high risk of breast cancer should talk to their health care providers about having an MRI and a mammogram every year.  Breast cancer gene (BRCA)-related cancer risk assessment is recommended for women who have family members with BRCA-related cancers. BRCA-related cancers include breast, ovarian, tubal, and peritoneal cancers. Having family members with these cancers may be  associated with an increased risk for harmful changes (mutations) in the breast cancer genes BRCA1 and BRCA2. Results of the assessment will determine the need for genetic counseling and BRCA1 and BRCA2 testing.  Routine pelvic exams to screen for cancer are no longer recommended for nonpregnant women who are considered low risk for cancer of the pelvic organs (ovaries, uterus, and vagina) and who do not have symptoms. Ask your health care provider if a screening pelvic exam is right for you.  If you have had past treatment for cervical cancer or a condition that could lead to cancer, you need Pap tests and screening for cancer for at least 20 years after your treatment. If Pap tests have been discontinued, your risk factors (such as having a new sexual partner) need to be reassessed to determine if screening should be resumed. Some women have medical problems that increase the chance of getting cervical cancer. In these cases, your health care provider may recommend more frequent screening and Pap tests.  The HPV test is an additional test that  may be used for cervical cancer screening. The HPV test looks for the virus that can cause the cell changes on the cervix. The cells collected during the Pap test can be tested for HPV. The HPV test could be used to screen women aged 61 years and older, and should be used in women of any age who have unclear Pap test results. After the age of 78, women should have HPV testing at the same frequency as a Pap test.  Colorectal cancer can be detected and often prevented. Most routine colorectal cancer screening begins at the age of 31 years and continues through age 51 years. However, your health care provider may recommend screening at an earlier age if you have risk factors for colon cancer. On a yearly basis, your health care provider may provide home test kits to check for hidden blood in the stool. Use of a small camera at the end of a tube, to directly examine the  colon (sigmoidoscopy or colonoscopy), can detect the earliest forms of colorectal cancer. Talk to your health care provider about this at age 84, when routine screening begins. Direct exam of the colon should be repeated every 5-10 years through age 37 years, unless early forms of pre-cancerous polyps or small growths are found.  People who are at an increased risk for hepatitis B should be screened for this virus. You are considered at high risk for hepatitis B if:  You were born in a country where hepatitis B occurs often. Talk with your health care provider about which countries are considered high risk.  Your parents were born in a high-risk country and you have not received a shot to protect against hepatitis B (hepatitis B vaccine).  You have HIV or AIDS.  You use needles to inject street drugs.  You live with, or have sex with, someone who has hepatitis B.  You get hemodialysis treatment.  You take certain medicines for conditions like cancer, organ transplantation, and autoimmune conditions.  Hepatitis C blood testing is recommended for all people born from [birth year] through 1965 and any individual with known risks for hepatitis C.  Practice safe sex. Use condoms and avoid high-risk sexual practices to reduce the spread of sexually transmitted infections (STIs). STIs include gonorrhea, chlamydia, syphilis, trichomonas, herpes, HPV, and human immunodeficiency virus (HIV). Herpes, HIV, and HPV are viral illnesses that have no cure. They can result in disability, cancer, and death.  You should be screened for sexually transmitted illnesses (STIs) including gonorrhea and chlamydia if:  You are sexually active and are younger than 24 years.  You are older than 24 years and your health care provider tells you that you are at risk for this type of infection.  Your sexual activity has changed since you were last screened and you are at an increased risk for chlamydia or gonorrhea. Ask your  health care provider if you are at risk.  If you are at risk of being infected with HIV, it is recommended that you take a prescription medicine daily to prevent HIV infection. This is called preexposure prophylaxis (PrEP). You are considered at risk if:  You are a heterosexual woman, are sexually active, and are at increased risk for HIV infection.  You take drugs by injection.  You are sexually active with a partner who has HIV.  Talk with your health care provider about whether you are at high risk of being infected with HIV. If you choose to begin PrEP, you should first be  tested for HIV. You should then be tested every 3 months for as long as you are taking PrEP.  Osteoporosis is a disease in which the bones lose minerals and strength with aging. This can result in serious bone fractures or breaks. The risk of osteoporosis can be identified using a bone density scan. Women ages 27 years and over and women at risk for fractures or osteoporosis should discuss screening with their health care providers. Ask your health care provider whether you should take a calcium supplement or vitamin D to reduce the rate of osteoporosis.  Menopause can be associated with physical symptoms and risks. Hormone replacement therapy is available to decrease symptoms and risks. You should talk to your health care provider about whether hormone replacement therapy is right for you.  Use sunscreen. Apply sunscreen liberally and repeatedly throughout the day. You should seek shade when your shadow is shorter than you. Protect yourself by wearing long sleeves, pants, a wide-brimmed hat, and sunglasses year round, whenever you are outdoors.  Once a month, do a whole body skin exam, using a mirror to look at the skin on your back. Tell your health care provider of new moles, moles that have irregular borders, moles that are larger than a pencil eraser, or moles that have changed in shape or color.  Stay current with  required vaccines (immunizations).  Influenza vaccine. All adults should be immunized every year.  Tetanus, diphtheria, and acellular pertussis (Td, Tdap) vaccine. Pregnant women should receive 1 dose of Tdap vaccine during each pregnancy. The dose should be obtained regardless of the length of time since the last dose. Immunization is preferred during the 27th-36th week of gestation. An adult who has not previously received Tdap or who does not know her vaccine status should receive 1 dose of Tdap. This initial dose should be followed by tetanus and diphtheria toxoids (Td) booster doses every 10 years. Adults with an unknown or incomplete history of completing a 3-dose immunization series with Td-containing vaccines should begin or complete a primary immunization series including a Tdap dose. Adults should receive a Td booster every 10 years.  Varicella vaccine. An adult without evidence of immunity to varicella should receive 2 doses or a second dose if she has previously received 1 dose. Pregnant females who do not have evidence of immunity should receive the first dose after pregnancy. This first dose should be obtained before leaving the health care facility. The second dose should be obtained 4-8 weeks after the first dose.  Human papillomavirus (HPV) vaccine. Females aged 13-26 years who have not received the vaccine previously should obtain the 3-dose series. The vaccine is not recommended for use in pregnant females. However, pregnancy testing is not needed before receiving a dose. If a female is found to be pregnant after receiving a dose, no treatment is needed. In that case, the remaining doses should be delayed until after the pregnancy. Immunization is recommended for any person with an immunocompromised condition through the age of 38 years if she did not get any or all doses earlier. During the 3-dose series, the second dose should be obtained 4-8 weeks after the first dose. The third dose  should be obtained 24 weeks after the first dose and 16 weeks after the second dose.  Zoster vaccine. One dose is recommended for adults aged 90 years or older unless certain conditions are present.  Measles, mumps, and rubella (MMR) vaccine. Adults born before [birth year] generally are considered immune to measles and  mumps. Adults born in [birth year] or later should have 1 or more doses of MMR vaccine unless there is a contraindication to the vaccine or there is laboratory evidence of immunity to each of the three diseases. A routine second dose of MMR vaccine should be obtained at least 28 days after the first dose for students attending postsecondary schools, health care workers, or international travelers. People who received inactivated measles vaccine or an unknown type of measles vaccine during 1963-1967 should receive 2 doses of MMR vaccine. People who received inactivated mumps vaccine or an unknown type of mumps vaccine before 1979 and are at high risk for mumps infection should consider immunization with 2 doses of MMR vaccine. For females of childbearing age, rubella immunity should be determined. If there is no evidence of immunity, females who are not pregnant should be vaccinated. If there is no evidence of immunity, females who are pregnant should delay immunization until after pregnancy. Unvaccinated health care workers born before [birth year] who lack laboratory evidence of measles, mumps, or rubella immunity or laboratory confirmation of disease should consider measles and mumps immunization with 2 doses of MMR vaccine or rubella immunization with 1 dose of MMR vaccine.  Pneumococcal 13-valent conjugate (PCV13) vaccine. When indicated, a person who is uncertain of her immunization history and has no record of immunization should receive the PCV13 vaccine. An adult aged 86 years or older who has certain medical conditions and has not been previously immunized should receive 1 dose of PCV13 vaccine. This PCV13  should be followed with a dose of pneumococcal polysaccharide (PPSV23) vaccine. The PPSV23 vaccine dose should be obtained at least 8 weeks after the dose of PCV13 vaccine. An adult aged 60 years or older who has certain medical conditions and previously received 1 or more doses of PPSV23 vaccine should receive 1 dose of PCV13. The PCV13 vaccine dose should be obtained 1 or more years after the last PPSV23 vaccine dose.  Pneumococcal polysaccharide (PPSV23) vaccine. When PCV13 is also indicated, PCV13 should be obtained first. All adults aged 39 years and older should be immunized. An adult younger than age 69 years who has certain medical conditions should be immunized. Any person who resides in a nursing home or long-term care facility should be immunized. An adult smoker should be immunized. People with an immunocompromised condition and certain other conditions should receive both PCV13 and PPSV23 vaccines. People with human immunodeficiency virus (HIV) infection should be immunized as soon as possible after diagnosis. Immunization during chemotherapy or radiation therapy should be avoided. Routine use of PPSV23 vaccine is not recommended for American Indians, [place] Natives, or people younger than 65 years unless there are medical conditions that require PPSV23 vaccine. When indicated, people who have unknown immunization and have no record of immunization should receive PPSV23 vaccine. One-time revaccination 5 years after the first dose of PPSV23 is recommended for people aged 19-64 years who have chronic kidney failure, nephrotic syndrome, asplenia, or immunocompromised conditions. People who received 1-2 doses of PPSV23 before age 34 years should receive another dose of PPSV23 vaccine at age 61 years or later if at least 5 years have passed since the previous dose. Doses of PPSV23 are not needed for people immunized with PPSV23 at or after age 66 years.  Meningococcal vaccine. Adults with asplenia or  persistent complement component deficiencies should receive 2 doses of quadrivalent meningococcal conjugate (MenACWY-D) vaccine. The doses should be obtained at least 2 months apart. Microbiologists working with certain meningococcal bacteria, [employer]  recruits, people at risk during an outbreak, and people who travel to or live in countries with a high rate of meningitis should be immunized. A first-year college student up through age 3 years who is living in a residence hall should receive a dose if she did not receive a dose on or after her 16th birthday. Adults who have certain high-risk conditions should receive one or more doses of vaccine.  Hepatitis A vaccine. Adults who wish to be protected from this disease, have certain high-risk conditions, work with hepatitis A-infected animals, work in hepatitis A research labs, or travel to or work in countries with a high rate of hepatitis A should be immunized. Adults who were previously unvaccinated and who anticipate close contact with an international adoptee during the first 60 days after arrival in the [place] from a country with a high rate of hepatitis A should be immunized.  Hepatitis B vaccine. Adults who wish to be protected from this disease, have certain high-risk conditions, may be exposed to blood or other infectious body fluids, are household contacts or sex partners of hepatitis B positive people, are clients or workers in certain care facilities, or travel to or work in countries with a high rate of hepatitis B should be immunized.  Haemophilus influenzae type b (Hib) vaccine. A previously unvaccinated person with asplenia or sickle cell disease or having a scheduled splenectomy should receive 1 dose of Hib vaccine. Regardless of previous immunization, a recipient of a hematopoietic stem cell transplant should receive a 3-dose series 6-12 months after her successful transplant. Hib vaccine is not recommended for adults with HIV  infection. Preventive Services / Frequency Ages 93 to 67 years  Blood pressure check.** / Every 1 to 2 years.  Lipid and cholesterol check.** / Every 5 years beginning at age 50.  Clinical breast exam.** / Every 3 years for women in their 83s and 89s.  BRCA-related cancer risk assessment.** / For women who have family members with a BRCA-related cancer (breast, ovarian, tubal, or peritoneal cancers).  Pap test.** / Every 2 years from ages 64 through 35. Every 3 years starting at age 61 through age 96 or 73 with a history of 3 consecutive normal Pap tests.  HPV screening.** / Every 3 years from ages 94 through ages 65 to 82 with a history of 3 consecutive normal Pap tests.  Hepatitis C blood test.** / For any individual with known risks for hepatitis C.  Skin self-exam. / Monthly.  Influenza vaccine. / Every year.  Tetanus, diphtheria, and acellular pertussis (Tdap, Td) vaccine.** / Consult your health care provider. Pregnant women should receive 1 dose of Tdap vaccine during each pregnancy. 1 dose of Td every 10 years.  Varicella vaccine.** / Consult your health care provider. Pregnant females who do not have evidence of immunity should receive the first dose after pregnancy.  HPV vaccine. / 3 doses over 6 months, if 43 and younger. The vaccine is not recommended for use in pregnant females. However, pregnancy testing is not needed before receiving a dose.  Measles, mumps, rubella (MMR) vaccine.** / You need at least 1 dose of MMR if you were born in 1957 or later. You may also need a 2nd dose. For females of childbearing age, rubella immunity should be determined. If there is no evidence of immunity, females who are not pregnant should be vaccinated. If there is no evidence of immunity, females who are pregnant should delay immunization until after pregnancy.  Pneumococcal 13-valent  conjugate (PCV13) vaccine.** / Consult your health care provider.  Pneumococcal polysaccharide  (PPSV23) vaccine.** / 1 to 2 doses if you smoke cigarettes or if you have certain conditions.  Meningococcal vaccine.** / 1 dose if you are age 35 to 59 years and a Market researcher living in a residence hall, or have one of several medical conditions, you need to get vaccinated against meningococcal disease. You may also need additional booster doses.  Hepatitis A vaccine.** / Consult your health care provider.  Hepatitis B vaccine.** / Consult your health care provider.  Haemophilus influenzae type b (Hib) vaccine.** / Consult your health care provider. Ages 3 to 26 years  Blood pressure check.** / Every 1 to 2 years.  Lipid and cholesterol check.** / Every 5 years beginning at age 8 years.  Lung cancer screening. / Every year if you are aged 110-80 years and have a 30-pack-year history of smoking and currently smoke or have quit within the past 15 years. Yearly screening is stopped once you have quit smoking for at least 15 years or develop a health problem that would prevent you from having lung cancer treatment.  Clinical breast exam.** / Every year after age 26 years.  BRCA-related cancer risk assessment.** / For women who have family members with a BRCA-related cancer (breast, ovarian, tubal, or peritoneal cancers).  Mammogram.** / Every year beginning at age 5 years and continuing for as long as you are in good health. Consult with your health care provider.  Pap test.** / Every 3 years starting at age 20 years through age 90 or 76 years with a history of 3 consecutive normal Pap tests.  HPV screening.** / Every 3 years from ages 31 years through ages 11 to 40 years with a history of 3 consecutive normal Pap tests.  Fecal occult blood test (FOBT) of stool. / Every year beginning at age 30 years and continuing until age 2 years. You may not need to do this test if you get a colonoscopy every 10 years.  Flexible sigmoidoscopy or colonoscopy.** / Every 5 years for a  flexible sigmoidoscopy or every 10 years for a colonoscopy beginning at age 74 years and continuing until age 32 years.  Hepatitis C blood test.** / For all people born from [birth year] through 1965 and any individual with known risks for hepatitis C.  Skin self-exam. / Monthly.  Influenza vaccine. / Every year.  Tetanus, diphtheria, and acellular pertussis (Tdap/Td) vaccine.** / Consult your health care provider. Pregnant women should receive 1 dose of Tdap vaccine during each pregnancy. 1 dose of Td every 10 years.  Varicella vaccine.** / Consult your health care provider. Pregnant females who do not have evidence of immunity should receive the first dose after pregnancy.  Zoster vaccine.** / 1 dose for adults aged 104 years or older.  Measles, mumps, rubella (MMR) vaccine.** / You need at least 1 dose of MMR if you were born in 1957 or later. You may also need a 2nd dose. For females of childbearing age, rubella immunity should be determined. If there is no evidence of immunity, females who are not pregnant should be vaccinated. If there is no evidence of immunity, females who are pregnant should delay immunization until after pregnancy.  Pneumococcal 13-valent conjugate (PCV13) vaccine.** / Consult your health care provider.  Pneumococcal polysaccharide (PPSV23) vaccine.** / 1 to 2 doses if you smoke cigarettes or if you have certain conditions.  Meningococcal vaccine.** / Consult your health care provider.  Hepatitis  A vaccine.** / Consult your health care provider.  Hepatitis B vaccine.** / Consult your health care provider.  Haemophilus influenzae type b (Hib) vaccine.** / Consult your health care provider. Ages 7 years and over  Blood pressure check.** / Every 1 to 2 years.  Lipid and cholesterol check.** / Every 5 years beginning at age 46 years.  Lung cancer screening. / Every year if you are aged 38-80 years and have a 30-pack-year history of smoking and currently smoke or have  quit within the past 15 years. Yearly screening is stopped once you have quit smoking for at least 15 years or develop a health problem that would prevent you from having lung cancer treatment.  Clinical breast exam.** / Every year after age 13 years.  BRCA-related cancer risk assessment.** / For women who have family members with a BRCA-related cancer (breast, ovarian, tubal, or peritoneal cancers).  Mammogram.** / Every year beginning at age 35 years and continuing for as long as you are in good health. Consult with your health care provider.  Pap test.** / Every 3 years starting at age 63 years through age 85 or 17 years with 3 consecutive normal Pap tests. Testing can be stopped between 65 and 70 years with 3 consecutive normal Pap tests and no abnormal Pap or HPV tests in the past 10 years.  HPV screening.** / Every 3 years from ages 58 years through ages 9 or 68 years with a history of 3 consecutive normal Pap tests. Testing can be stopped between 65 and 70 years with 3 consecutive normal Pap tests and no abnormal Pap or HPV tests in the past 10 years.  Fecal occult blood test (FOBT) of stool. / Every year beginning at age 35 years and continuing until age 33 years. You may not need to do this test if you get a colonoscopy every 10 years.  Flexible sigmoidoscopy or colonoscopy.** / Every 5 years for a flexible sigmoidoscopy or every 10 years for a colonoscopy beginning at age 81 years and continuing until age 37 years.  Hepatitis C blood test.** / For all people born from [birth year] through 1965 and any individual with known risks for hepatitis C.  Osteoporosis screening.** / A one-time screening for women ages 90 years and over and women at risk for fractures or osteoporosis.  Skin self-exam. / Monthly.  Influenza vaccine. / Every year.  Tetanus, diphtheria, and acellular pertussis (Tdap/Td) vaccine.** / 1 dose of Td every 10 years.  Varicella vaccine.** / Consult your health care  provider.  Zoster vaccine.** / 1 dose for adults aged 64 years or older.  Pneumococcal 13-valent conjugate (PCV13) vaccine.** / Consult your health care provider.  Pneumococcal polysaccharide (PPSV23) vaccine.** / 1 dose for all adults aged 13 years and older.  Meningococcal vaccine.** / Consult your health care provider.  Hepatitis A vaccine.** / Consult your health care provider.  Hepatitis B vaccine.** / Consult your health care provider.  Haemophilus influenzae type b (Hib) vaccine.** / Consult your health care provider. ** Family history and personal history of risk and conditions may change your health care provider's recommendations. Document Released: 06/29/2001 Document Revised: 09/17/2013 Document Reviewed: 09/28/2010 [facility] Patient Information 2015 [place], [place]. This information is not intended to replace advice given to you by your health care provider. Make sure you discuss any questions you have with your health care provider.

## 2014-09-10 NOTE — Assessment & Plan Note (Signed)
Denies history of abnormal mammogram. Average risk. We'll place order for screening mammogram.

## 2014-09-10 NOTE — Assessment & Plan Note (Signed)
Health maintenance reviewed. Patient up-to-date on her immunizations. Endorses tetanus 3 years ago. Is due for screening mammogram and retinal examination. Is at average risk for colorectal cancer, so was not a candidate for screening colonoscopy until 50 years. Patient is establishing with a gynecologist for her routine gynecological examination. Depression screen negative. We'll obtain fasting labs today. Handout given. Follow-up will be based on lab results.

## 2014-09-10 NOTE — Progress Notes (Signed)
Patient presents to clinic today for annual exam.  Patient is fasting for labs.  Acute Concerns: Patient endorses 20 pound weight gain over the past year. Endorses continued weight despite watching diet and exercising daily, up until her neck surgery 4 weeks ago.  Is unsure of family history of thyroid disorder. Endorses constipation. Endorses puffy ankles. Endorses being cold natured overall.   Patient also complains of insomnia.  Endorses 3-4 hours of sleep per night.  Endorses difficulty falling asleep ans staying asleep. Has taken Advil PM with minimal relief of symptoms.  Chronic Issues: Cervical Disc Disease -- s/p surgical decompression.  Doing very well.  No recurrent pain. Has regained feeling in upper extremities.  Health Maintenance: Dental -- up-to-date Vision -- up-to-date Immunizations -- Endorses Tetanus done in 2013. Mammogram -- Last in 2014.  No history of abnormal mammogram. PAP -- s/p hysterectomy. No hx of abnormal PAP smear.  Past Medical History  Diagnosis Date  . Impingement syndrome of right shoulder   . Cervical disc disease   . Elevated blood pressure (not hypertension)     Past Surgical History  Procedure Laterality Date  . Tonsillectomy  age 49  . Vaginal hysterectomy  03-09-2000    W/ LEFT SALPINGOOPHECTOMY  . Shoulder surgery  2013    Right, Bursa  . Neck surgery  07/23/2014    Current Outpatient Prescriptions on File Prior to Visit  Medication Sig Dispense Refill  . Nutritional Supplements (JUICE PLUS FIBRE PO) Take 2 capsules by mouth daily. 2 Berries, 2 Vegs, 2 Fruits     No current facility-administered medications on file prior to visit.    No Known Allergies  Family History  Problem Relation Age of Onset  . Heart disease Father     Living  . Fibromyalgia Mother   . Allergies Brother   . Healthy Son     x2  . Polycystic ovary syndrome Daughter     x1    History   Social History  . Marital Status: Divorced    Spouse  Name: N/A  . Number of Children: N/A  . Years of Education: N/A   Occupational History  .      Librarian, academicexecutive director of law firm   Social History Main Topics  . Smoking status: Never Smoker   . Smokeless tobacco: Never Used  . Alcohol Use: 0.0 oz/week    0 Standard drinks or equivalent per week     Comment: rare  . Drug Use: No  . Sexual Activity: Yes    Birth Control/ Protection: Surgical   Other Topics Concern  . Not on file   Social History Narrative   Review of Systems  Constitutional: Negative for fever and weight loss.  HENT: Negative for ear discharge, ear pain, hearing loss and tinnitus.   Eyes: Negative for blurred vision, double vision, photophobia and pain.  Respiratory: Negative for cough and shortness of breath.   Cardiovascular: Negative for chest pain and palpitations.  Gastrointestinal: Positive for constipation. Negative for heartburn, nausea, vomiting, abdominal pain, diarrhea, blood in stool and melena.  Genitourinary: Negative for dysuria, urgency, frequency, hematuria and flank pain.  Musculoskeletal: Negative for falls.  Neurological: Negative for dizziness, loss of consciousness and headaches.  Endo/Heme/Allergies: Negative for environmental allergies.  Psychiatric/Behavioral: Negative for depression, suicidal ideas, hallucinations and substance abuse. The patient is not nervous/anxious and does not have insomnia.    BP 138/88 mmHg  Pulse 76  Temp(Src) 98 F (36.7 C) (Oral)  Resp 16  Ht 5' 2.5" (1.588 m)  Wt 180 lb (81.647 kg)  BMI 32.38 kg/m2  SpO2 99%  Physical Exam  Constitutional: She is oriented to person, place, and time and well-developed, well-nourished, and in no distress.  HENT:  Head: Normocephalic and atraumatic.  Right Ear: Tympanic membrane, external ear and ear canal normal.  Left Ear: Tympanic membrane, external ear and ear canal normal.  Nose: Nose normal. No mucosal edema.  Mouth/Throat: Uvula is midline, oropharynx is clear  and moist and mucous membranes are normal. No oropharyngeal exudate or posterior oropharyngeal erythema.  Eyes: Conjunctivae are normal. Pupils are equal, round, and reactive to light.  Neck: Neck supple. No thyromegaly present.  Cardiovascular: Normal rate, regular rhythm, normal heart sounds and intact distal pulses.   Pulmonary/Chest: Effort normal and breath sounds normal. No respiratory distress. She has no wheezes. She has no rales.  Abdominal: Soft. Bowel sounds are normal. She exhibits no distension and no mass. There is no tenderness. There is no rebound and no guarding.  Lymphadenopathy:    She has no cervical adenopathy.  Neurological: She is alert and oriented to person, place, and time. No cranial nerve deficit.  Skin: Skin is warm and dry. No rash noted.  Psychiatric: Affect normal.  Vitals reviewed.  Assessment/Plan: Cervical disc disorder with radiculopathy of cervical region Status post surgical decompression. Doing very well. No residual symptoms. Continue follow-up with surgeon.   Breast cancer screening Denies history of abnormal mammogram. Average risk. We'll place order for screening mammogram.   Chronic constipation Also with symptoms of fatigue, weight gain and cold intolerance. Will obtain CBC, CMP, CRP, FSH and thyroid panel today to further assess. Supportive measures discussed with patient. Treatment will be based on lab results.   Weight gain With other signs concerning for hypothyroidism. Will obtain lab panel today to include TSH, free T4, T3 uptake, FSH, and high sensitivity CRP. Encouraged continued diet and exercise regimen. We'll treat based on results.   Visit for preventive health examination Health maintenance reviewed. Patient up-to-date on her immunizations. Endorses tetanus 3 years ago. Is due for screening mammogram and retinal examination. Is at average risk for colorectal cancer, so was not a candidate for screening colonoscopy until 50  years. Patient is establishing with a gynecologist for her routine gynecological examination. Depression screen negative. We'll obtain fasting labs today. Handout given. Follow-up will be based on lab results.

## 2014-09-11 ENCOUNTER — Telehealth: Payer: Self-pay

## 2014-09-11 DIAGNOSIS — R7989 Other specified abnormal findings of blood chemistry: Secondary | ICD-10-CM

## 2014-09-11 NOTE — Telephone Encounter (Signed)
Referral placed.  Have put her in with Dr. Creola Cornherghe, who is fantastic. Recommend for potential menopausal symptoms to start Black Cohosh supplement.  Can find at any pharmacy in the vitamin section.  Take as directed. Endo will also help discuss further treatment for menopause after reassessing her thyroid function, in case symptoms still mainly stemming from thyroid.

## 2014-09-11 NOTE — Telephone Encounter (Signed)
-----   Message from Waldon MerlWilliam C Martin, PA-C sent at 09/11/2014 10:48 AM EDT ----- Labs great overall. No evidence of inflammation as CRP is normal. FSH confirms menopausal status.  Thyroid testing shows some mild abnormalities but nothing major -- TSH and T4 are normal but T3 Uptake is low.  I feel she would benefit from seeing an Endocrinologist to help get further testing as most of her symptoms seem consistent with thyroid abnormality. I think it is best they take a look at labs and clinical picture to form treatment plan. I will place referral if she is willing.

## 2014-09-11 NOTE — Telephone Encounter (Signed)
Pt would like to know if there is anything that can be done as far as the menopausal concerns. Also she would like to have the referral put in for Endocrinology but would like to know which provider you put her in to. Thanks.

## 2014-09-12 NOTE — Telephone Encounter (Signed)
Patient notified verbalized understanding

## 2014-10-03 ENCOUNTER — Ambulatory Visit (INDEPENDENT_AMBULATORY_CARE_PROVIDER_SITE_OTHER): Payer: BLUE CROSS/BLUE SHIELD | Admitting: Internal Medicine

## 2014-10-03 ENCOUNTER — Encounter: Payer: Self-pay | Admitting: Internal Medicine

## 2014-10-03 VITALS — BP 122/80 | HR 80 | Temp 97.9°F | Resp 12 | Ht 62.0 in | Wt 180.0 lb

## 2014-10-03 DIAGNOSIS — R946 Abnormal results of thyroid function studies: Secondary | ICD-10-CM

## 2014-10-03 DIAGNOSIS — R5382 Chronic fatigue, unspecified: Secondary | ICD-10-CM | POA: Diagnosis not present

## 2014-10-03 MED ORDER — DEXAMETHASONE 1 MG PO TABS: ORAL_TABLET | ORAL | Status: DC

## 2014-10-03 NOTE — Patient Instructions (Signed)
Please stop at the lab.  Please take Dexamethasone 1 mg at 11 pm before coming to the lab at 8 am.  If the investigation above is negative, I would suggest discussing HRT with ObGyn.  Please come back for a follow-up appointment in 6 months.

## 2014-10-03 NOTE — Progress Notes (Signed)
Patient ID: Karen Gregory, female   DOB: 02/06/1966, 49 y.o.   MRN: [id number]   HPI  Karen Gregory is a 49 y.o.-year-old female, referred by her PCP, Piedad ClimesMartin, William Cody, PA-C in consultation for an abnormal thyroid test.  Pt. has been found to have an abnormal T3 uptake test (low) recently, at the visit with PCP. Her TSH and free T4 levels were reviewed, and they were normal.  She describes that she is an avid exerciser and tries to have a good diet, but has noticed a lot of weight gain lately along with other symptoms. She has a lot of stress at work, also. She describes: - ankle swollen - retaining fluid - gained 20 lbs - occasional blurry vision - HAs for 3-4 days - constipation - poor short term memory - not sleeping - exhausting - hot flushes  Meals: - Breakfast: Smoothie - Lunch: Salad with protein - Dinner: Meat + 2 veggies - Snacks: 2x a day - protein bar, almonds, fruit, celery with humus or peanut butter   She exercises (running 3-4 times a week, weights twice a week).  She had neck surgery in 07/2014.  Stress test was normal in 2015.  I reviewed pt's thyroid tests: Lab Results  Component Value Date   TSH 0.66 09/10/2014   TSH 0.763 05/01/2014   FREET4 0.85 09/10/2014   FREET4 1.28 05/01/2014    Pt denies feeling nodules in neck, dysphagia/odynophagia, SOB with lying down.  She has + FH of thyroid disorders: Hashimoto thyroiditis and Graves' disease in 3 of her 4 brothers. No FH of thyroid cancer.  No h/o radiation tx to head or neck.  I reviewed her chart and she also has a history of hypertension, hysterectomy at [age].  ROS: Constitutional: + see HPI Eyes:+  blurry vision, no xerophthalmia ENT: no sore throat, no nodules palpated in throat, no dysphagia/odynophagia, + hoarseness Cardiovascular: no CP/SOB/palpitations/+ leg swelling Respiratory: no cough/SOB Gastrointestinal: no N/V/D/+ C Musculoskeletal: no muscle/+ joint aches Skin: no rashes,  + itching Neurological: no tremors/numbness/tingling/dizziness, +  headache  Psychiatric: no depression/anxiety  Past Medical History  Diagnosis Date  . Impingement syndrome of right shoulder   . Cervical disc disease   . Elevated blood pressure (not hypertension)    Past Surgical History  Procedure Laterality Date  . Tonsillectomy  age 49  . Vaginal hysterectomy  03-09-2000    W/ LEFT SALPINGOOPHECTOMY  . Shoulder surgery  2013    Right, Bursa  . Neck surgery  07/23/2014   History   Social History  . Marital Status: Divorced, getting married soon     Spouse Name: N/A  . Number of Children: 3   Occupational History  .      Librarian, academicexecutive director of law firm   Social History Main Topics  . Smoking status: Never Smoker   . Smokeless tobacco: Never Used  . Alcohol Use: 0.0 oz/week    0 Standard drinks or equivalent per week     Comment: rare  . Drug Use: No  . Sexual Activity: Yes    Birth Control/ Protection: Surgical   Current Outpatient Prescriptions on File Prior to Visit  Medication Sig Dispense Refill  . Nutritional Supplements (JUICE PLUS FIBRE PO) Take 2 capsules by mouth daily. 2 Berries, 2 Vegs, 2 Fruits     No current facility-administered medications on file prior to visit.   No Known Allergies Family History  Problem Relation Age of Onset  . Heart disease  Father     Living  . Fibromyalgia Mother   . Allergies Brother   . Healthy Son     x2  . Polycystic ovary syndrome Daughter     x1   PE: BP 122/80 mmHg  Pulse 80  Temp(Src) 97.9 F (36.6 C) (Oral)  Resp 12  Ht 5\' 2"  (1.575 m)  Wt 180 lb (81.647 kg)  BMI 32.91 kg/m2  SpO2 96% Wt Readings from Last 3 Encounters:  10/03/14 180 lb (81.647 kg)  09/10/14 180 lb (81.647 kg)  06/05/14 183 lb (83.008 kg)   Constitutional: overweight, in NAD Eyes: PERRLA, EOMI, no exophthalmos ENT: moist mucous membranes, no thyromegaly, no cervical lymphadenopathy, healed scar at the size of her right lower  neck from cervical spine surgery Cardiovascular: RRR, No MRG Respiratory: CTA B Gastrointestinal: abdomen soft, NT, ND, BS+ Musculoskeletal: no deformities, strength intact in all 4 Skin: moist, warm, no rashes Neurological: no tremor with outstretched hands, DTR normal in all 4  ASSESSMENT: 1. Abnormal thyroid test - FH of Graves ds and Hashimoto's ds.  2. Fatigue  PLAN:  1. Patient with normal TFTs but abnormal T3 uptake. I explained that in the context of a normal fT4, the T3 uptake is not valuable (since we were checking this before the advent of fT4 testing - to calculate the fT4 by multiplying the T3 uptake by the TBG).  - She appears euthyroid. She does not appear to have a goiter, thyroid nodules, or neck compression symptoms - will check thyroid tests today: TSH, free T4, free T4 and TPO antibodies. We will check the antibodies since she has family history of both Graves' disease and Hashimoto's thyroiditis. - We did discuss about Hashimoto thyroiditis: This is an autoimmune disease, and having high thyroid antibodies can make her feel hypothyroid despite normal thyroid tests. There is no treatment for Hashimoto thyroiditis other than treating abnormal thyroid function tests with levothyroxine. We also discussed about how stress influences the antibody levels. - If these are abnormal, she will need to return in 6 weeks for repeat labs and in 6 months for an appointment - If the labs are normal, then I will see her back on an as needed basis  2. Fatigue - also weight gain, fluid retention, insomnia, poor memory, constipation - reviewed previous labs: - menopausal (high FSH) (had hysterectomy + unilateral oophorectomy in her 30's) - No DM (Normal HbA1c) - no anemia - has transaminitis, increased after tylenol after her neck surgery - We can check for Cushing's ds, although I have low suspicion for this  - will check a Dexamethasone suppression tes - if TFTs and DST normal >> I  suggested HRT  - per ObGyn  Component     Latest Ref Rng 10/03/2014  TSH     0.35 - 4.50 uIU/mL 0.63  Free T4     0.60 - 1.60 ng/dL 1.300.78  T3, Free     2.3 - 4.2 pg/mL 3.0  Thyroperoxidase Ab SerPl-aCnc     0 - 34 IU/mL 11  Thyroid tests normal and also normal TPO antibodies. DST negative >> cortisol suppressed very well to 0.8. No sign of Cushing sd.

## 2014-10-04 ENCOUNTER — Other Ambulatory Visit (INDEPENDENT_AMBULATORY_CARE_PROVIDER_SITE_OTHER): Payer: BLUE CROSS/BLUE SHIELD

## 2014-10-04 DIAGNOSIS — R946 Abnormal results of thyroid function studies: Secondary | ICD-10-CM | POA: Diagnosis not present

## 2014-10-04 LAB — T4, FREE: FREE T4: 0.78 ng/dL (ref 0.60–1.60)

## 2014-10-04 LAB — CORTISOL: Cortisol, Plasma: 0.8 ug/dL

## 2014-10-04 LAB — TSH: TSH: 0.63 u[IU]/mL (ref 0.35–4.50)

## 2014-10-04 LAB — T3, FREE: T3, Free: 3 pg/mL (ref 2.3–4.2)

## 2014-10-05 LAB — SPECIMEN STATUS REPORT

## 2014-10-05 LAB — THYROID PEROXIDASE ANTIBODY: THYROID PEROXIDASE ANTIBODY: 11 [IU]/mL (ref 0–34)

## 2014-10-09 ENCOUNTER — Ambulatory Visit (INDEPENDENT_AMBULATORY_CARE_PROVIDER_SITE_OTHER): Payer: BLUE CROSS/BLUE SHIELD | Admitting: Physician Assistant

## 2014-10-09 ENCOUNTER — Encounter: Payer: Self-pay | Admitting: Physician Assistant

## 2014-10-09 VITALS — BP 140/70 | HR 81 | Temp 98.0°F | Ht 62.0 in | Wt 182.6 lb

## 2014-10-09 DIAGNOSIS — N951 Menopausal and female climacteric states: Secondary | ICD-10-CM

## 2014-10-09 DIAGNOSIS — Z780 Asymptomatic menopausal state: Secondary | ICD-10-CM | POA: Diagnosis not present

## 2014-10-09 NOTE — Progress Notes (Signed)
Patient presents to clinic today for consult regarding results from testing performed by Endocrinology.  Repeat thyroid testing including anti TPO ab.  TSH level is changing so follow-up recommended in 6 months.  Endocrinologist felt symptoms solely related to menopause and recommended patient follow-up with her GYN regarding initiating HRT.  Past Medical History  Diagnosis Date  . Impingement syndrome of right shoulder   . Cervical disc disease   . Elevated blood pressure (not hypertension)     Current Outpatient Prescriptions on File Prior to Visit  Medication Sig Dispense Refill  . Nutritional Supplements (JUICE PLUS FIBRE PO) Take 2 capsules by mouth daily. 2 Berries, 2 Vegs, 2 Fruits    . dexamethasone (DECADRON) 1 MG tablet Take 1 tablet by mouth once at 11 pm, before coming for labs at 8 am the next morning (Patient not taking: Reported on 10/09/2014) 1 tablet 0   No current facility-administered medications on file prior to visit.    No Known Allergies  Family History  Problem Relation Age of Onset  . Heart disease Father     Living  . Fibromyalgia Mother   . Allergies Brother   . Healthy Son     x2  . Polycystic ovary syndrome Daughter     x1    History   Social History  . Marital Status: Divorced    Spouse Name: N/A  . Number of Children: N/A  . Years of Education: N/A   Occupational History  .      Development worker, [employer]   Social History Main Topics  . Smoking status: Never Smoker   . Smokeless tobacco: Never Used  . Alcohol Use: 0.0 oz/week    0 Standard drinks or equivalent per week     Comment: rare  . Drug Use: No  . Sexual Activity: Yes    Birth Control/ Protection: Surgical   Other Topics Concern  . None   Social History Narrative   Review of Systems - See HPI.  All other ROS are negative.  BP 140/70 mmHg  Pulse 81  Temp(Src) 98 F (36.7 C) (Oral)  Ht _0  (1.575 m)  Wt 182 lb 9.6 oz (82.827 kg)  BMI 33.39 kg/m2  SpO2  100%  Physical Exam Consult only so Constitutional exam performed only -- Patient alert/oriented x 3, well-developed, well nourished and in NAD.  Recent Results (from the past 2160 hour(s))  CBC     Status: None   Collection Time: 09/10/14 10:12 AM  Result Value Ref Range   WBC 5.1 4.0 - 10.5 K/uL   RBC 4.56 3.87 - 5.11 Mil/uL   Platelets 348.0 150.0 - 400.0 K/uL   Hemoglobin 14.0 12.0 - 15.0 g/dL   HCT 40.5 36.0 - 46.0 %   MCV 88.9 78.0 - 100.0 fl   MCHC 34.5 30.0 - 36.0 g/dL   RDW 12.2 11.5 - 15.5 %  Comp Met (CMET)     Status: Abnormal   Collection Time: 09/10/14 10:12 AM  Result Value Ref Range   Sodium 137 135 - 145 mEq/L   Potassium 3.8 3.5 - 5.1 mEq/L   Chloride 105 96 - 112 mEq/L   CO2 27 19 - 32 mEq/L   Glucose, Bld 106 (H) 70 - 99 mg/dL   BUN 12 6 - 23 mg/dL   Creatinine, Ser 0.74 0.40 - 1.20 mg/dL   Total Bilirubin 0.4 0.2 - 1.2 mg/dL   Alkaline Phosphatase 91 39 - 117 U/L  AST 45 (H) 0 - 37 U/L   ALT 70 (H) 0 - 35 U/L   Total Protein 7.2 6.0 - 8.3 g/dL   Albumin 4.4 3.5 - 5.2 g/dL   Calcium 9.5 8.4 - 10.5 mg/dL   GFR 88.82 >60.00 mL/min  TSH     Status: None   Collection Time: 09/10/14 10:12 AM  Result Value Ref Range   TSH 0.66 0.35 - 4.50 uIU/mL  T4, free     Status: None   Collection Time: 09/10/14 10:12 AM  Result Value Ref Range   Free T4 0.85 0.60 - 1.60 ng/dL  T3 Uptake     Status: Abnormal   Collection Time: 09/10/14 10:12 AM  Result Value Ref Range   T3 Uptake <15 (L) 22 - 35 %  Hemoglobin A1c     Status: None   Collection Time: 09/10/14 10:12 AM  Result Value Ref Range   Hgb A1c MFr Bld 5.3 4.6 - 6.5 %    Comment: Glycemic Control Guidelines for People with Diabetes:Non Diabetic:  <6%Goal of Therapy: <7%Additional Action Suggested:  >8%   Lipid panel     Status: Abnormal   Collection Time: 09/10/14 10:12 AM  Result Value Ref Range   Cholesterol 167 0 - 200 mg/dL    Comment: ATP III Classification       Desirable:  < 200 mg/dL                Borderline High:  200 - 239 mg/dL          High:  > = 240 mg/dL   Triglycerides 61.0 0.0 - 149.0 mg/dL    Comment: Normal:  <150 mg/dLBorderline High:  150 - 199 mg/dL   HDL 52.60 >39.00 mg/dL   VLDL 12.2 0.0 - 40.0 mg/dL   LDL Cholesterol 102 (H) 0 - 99 mg/dL   Total CHOL/HDL Ratio 3     Comment:                Men          Women1/2 Average Risk     3.4          3.3Average Risk          5.0          4.42X Average Risk          9.6          7.13X Average Risk          15.0          11.0                       NonHDL 114.40     Comment: NOTE:  Non-HDL goal should be 30 mg/dL higher than patient's LDL goal (i.e. LDL goal of < 70 mg/dL, would have non-HDL goal of < 100 mg/dL)  Urinalysis, Routine w reflex microscopic     Status: None   Collection Time: 09/10/14 10:12 AM  Result Value Ref Range   Color, Urine YELLOW Yellow;Lt. Yellow   APPearance CLEAR Clear   Specific Gravity, Urine 1.010 1.000-1.030   pH 6.0 5.0 - 8.0   Total Protein, Urine NEGATIVE Negative   Urine Glucose NEGATIVE Negative   Ketones, ur NEGATIVE Negative   Bilirubin Urine NEGATIVE Negative   Hgb urine dipstick NEGATIVE Negative   Urobilinogen, UA 0.2 0.0 - 1.0   Leukocytes, UA NEGATIVE Negative   Nitrite NEGATIVE Negative   WBC,  UA 0-2/hpf 0-2/hpf   RBC / HPF none seen 0-2/hpf   Squamous Epithelial / LPF Rare(0-4/hpf) Rare(0-4/hpf)  CRP High sensitivity     Status: None   Collection Time: 09/10/14 10:12 AM  Result Value Ref Range   CRP, High Sensitivity 1.660 0.000 - 5.000 mg/L    Comment: Note:  An elevated hs-CRP (>5 mg/L) should be repeated after 2 weeks to rule out recent infection or trauma.  [place]     Status: None   Collection Time: 09/10/14 10:12 AM  Result Value Ref Range   FSH 57.4 mIU/ML    Comment: Female Reference Range:  1.4-18.1 mIU/mLFemale Reference Range:Follicular Phase          2.5-10.2 mIU/mLMidCycle Peak          3.4-33.4 mIU/mLLuteal Phase          1.5-9.1 mIU/mLPost Menopausal     23.0-116.3  mIU/mLPregnant          <0.3 mIU/mL  Thyroid Peroxidase Antibody     Status: None   Collection Time: 10/03/14 12:00 AM  Result Value Ref Range   Thyroperoxidase Ab SerPl-aCnc 11 0 - 34 IU/mL  Specimen status report     Status: None   Collection Time: 10/03/14 12:00 AM  Result Value Ref Range   specimen status report Comment     Comment: Written Authorization Written Authorization Written Authorization Received. Authorization received from original req 10-04-2014 Logged by Milana Obey   TSH     Status: None   Collection Time: 10/03/14 11:42 AM  Result Value Ref Range   TSH 0.63 0.35 - 4.50 uIU/mL  T4, free     Status: None   Collection Time: 10/03/14 11:42 AM  Result Value Ref Range   Free T4 0.78 0.60 - 1.60 ng/dL  T3, free     Status: None   Collection Time: 10/03/14 11:42 AM  Result Value Ref Range   T3, Free 3.0 2.3 - 4.2 pg/mL  Cortisol     Status: None   Collection Time: 10/04/14  8:26 AM  Result Value Ref Range   Cortisol, Plasma 0.8 ug/dL    Comment: AM:  4.3 - 22.4 ug/dLPM:  3.1 - 16.7 ug/dL   Assessment/Plan: Post menopausal syndrome Reviewed results and Endo recommendations with patient.  Referral placed to GYN to discuss HRT for symptoms.

## 2014-10-09 NOTE — Patient Instructions (Signed)
You will be contacted by Dr. Jorene Minorsaavon's office to schedule an appointment regarding menopausal symptoms and management.  Let me know if you have not hear from them within a week.

## 2014-10-09 NOTE — Progress Notes (Signed)
Pre visit review using our clinic review tool, if applicable. No additional management support is needed unless otherwise documented below in the visit note. 

## 2014-10-10 ENCOUNTER — Encounter: Payer: Self-pay | Admitting: Physician Assistant

## 2014-10-11 DIAGNOSIS — N951 Menopausal and female climacteric states: Secondary | ICD-10-CM | POA: Insufficient documentation

## 2014-10-11 NOTE — Assessment & Plan Note (Signed)
Reviewed results and Endo recommendations with patient.  Referral placed to GYN to discuss HRT for symptoms.

## 2015-04-07 ENCOUNTER — Ambulatory Visit: Payer: BLUE CROSS/BLUE SHIELD | Admitting: Internal Medicine

## 2015-04-07 DIAGNOSIS — Z0289 Encounter for other administrative examinations: Secondary | ICD-10-CM

## 2015-09-11 ENCOUNTER — Telehealth: Payer: Self-pay | Admitting: Behavioral Health

## 2015-09-11 NOTE — Telephone Encounter (Signed)
Unable to reach patient at time of Pre-Visit Call.  Left message for patient to return call when available.    

## 2015-09-12 ENCOUNTER — Encounter: Payer: Self-pay | Admitting: Physician Assistant

## 2015-09-12 ENCOUNTER — Ambulatory Visit (INDEPENDENT_AMBULATORY_CARE_PROVIDER_SITE_OTHER): Payer: Self-pay | Admitting: Physician Assistant

## 2015-09-12 VITALS — BP 120/86 | HR 80 | Temp 98.0°F | Resp 16 | Ht 62.0 in | Wt 161.5 lb

## 2015-09-12 DIAGNOSIS — Z0000 Encounter for general adult medical examination without abnormal findings: Secondary | ICD-10-CM

## 2015-09-12 DIAGNOSIS — Z1239 Encounter for other screening for malignant neoplasm of breast: Secondary | ICD-10-CM | POA: Insufficient documentation

## 2015-09-12 DIAGNOSIS — Z6829 Body mass index (BMI) 29.0-29.9, adult: Secondary | ICD-10-CM | POA: Insufficient documentation

## 2015-09-12 LAB — LIPID PANEL
CHOLESTEROL: 166 mg/dL (ref 0–200)
HDL: 47.9 mg/dL (ref >39.00)
LDL Cholesterol: 101 mg/dL — ABNORMAL HIGH (ref 0–99)
NonHDL: 118.43
TRIGLYCERIDES: 86 mg/dL (ref 0.0–149.0)
Total CHOL/HDL Ratio: 3
VLDL: 17.2 mg/dL (ref 0.0–40.0)

## 2015-09-12 LAB — URINALYSIS, ROUTINE W REFLEX MICROSCOPIC
BILIRUBIN URINE: NEGATIVE
HGB URINE DIPSTICK: NEGATIVE
KETONES UR: NEGATIVE
Leukocytes, UA: NEGATIVE
NITRITE: NEGATIVE
Specific Gravity, Urine: 1.01 (ref 1.000–1.030)
Total Protein, Urine: NEGATIVE
UROBILINOGEN UA: 0.2 (ref 0.0–1.0)
Urine Glucose: NEGATIVE
pH: 6 (ref 5.0–8.0)

## 2015-09-12 LAB — COMPREHENSIVE METABOLIC PANEL
ALK PHOS: 71 U/L (ref 39–117)
ALT: 38 U/L — AB (ref 0–35)
AST: 30 U/L (ref 0–37)
Albumin: 4.1 g/dL (ref 3.5–5.2)
BILIRUBIN TOTAL: 0.5 mg/dL (ref 0.2–1.2)
BUN: 11 mg/dL (ref 6–23)
CALCIUM: 9.4 mg/dL (ref 8.4–10.5)
CO2: 29 meq/L (ref 19–32)
Chloride: 103 mEq/L (ref 96–112)
Creatinine, Ser: 0.82 mg/dL (ref 0.40–1.20)
GFR: 78.57 mL/min (ref >60.00)
Glucose, Bld: 110 mg/dL — ABNORMAL HIGH (ref 70–99)
POTASSIUM: 3.9 meq/L (ref 3.5–5.1)
Sodium: 136 mEq/L (ref 135–145)
TOTAL PROTEIN: 7.1 g/dL (ref 6.0–8.3)

## 2015-09-12 LAB — CBC
HCT: 39.9 % (ref 36.0–46.0)
Hemoglobin: 13.4 g/dL (ref 12.0–15.0)
MCHC: 33.7 g/dL (ref 30.0–36.0)
MCV: 91.6 fl (ref 78.0–100.0)
Platelets: 339 10*3/uL (ref 150.0–400.0)
RBC: 4.36 Mil/uL (ref 3.87–5.11)
RDW: 13 % (ref 11.5–15.5)
WBC: 6.2 10*3/uL (ref 4.0–10.5)

## 2015-09-12 LAB — TSH: TSH: 0.65 u[IU]/mL (ref 0.35–4.50)

## 2015-09-12 LAB — HEMOGLOBIN A1C: Hgb A1c MFr Bld: 5.4 % (ref 4.6–6.5)

## 2015-09-12 NOTE — Patient Instructions (Signed)
Please go to the lab for blood work.   Our office will call you with your results unless you have chosen to receive results via MyChart.  If your blood work is normal we will follow-up each year for physicals and as scheduled for chronic medical problems.  If anything is abnormal we will treat accordingly and get you in for a follow-up.  Preventive Care for Adults, Female A healthy lifestyle and preventive care can promote health and wellness. Preventive health guidelines for women include the following key practices.  A routine yearly physical is a good way to check with your health care provider about your health and preventive screening. It is a chance to share any concerns and updates on your health and to receive a thorough exam.  Visit your dentist for a routine exam and preventive care every 6 months. Brush your teeth twice a day and floss once a day. Good oral hygiene prevents tooth decay and gum disease.  The frequency of eye exams is based on your age, health, family medical history, use of contact lenses, and other factors. Follow your health care provider's recommendations for frequency of eye exams.  Eat a healthy diet. Foods like vegetables, fruits, whole grains, low-fat dairy products, and lean protein foods contain the nutrients you need without too many calories. Decrease your intake of foods high in solid fats, added sugars, and salt. Eat the right amount of calories for you.Get information about a proper diet from your health care provider, if necessary.  Regular physical exercise is one of the most important things you can do for your health. Most adults should get at least 150 minutes of moderate-intensity exercise (any activity that increases your heart rate and causes you to sweat) each week. In addition, most adults need muscle-strengthening exercises on 2 or more days a week.  Maintain a healthy weight. The body mass index (BMI) is a screening tool to identify possible  weight problems. It provides an estimate of body fat based on height and weight. Your health care provider can find your BMI and can help you achieve or maintain a healthy weight.For adults 20 years and older:  A BMI below 18.5 is considered underweight.  A BMI of 18.5 to 24.9 is normal.  A BMI of 25 to 29.9 is considered overweight.  A BMI of 30 and above is considered obese.  Maintain normal blood lipids and cholesterol levels by exercising and minimizing your intake of saturated fat. Eat a balanced diet with plenty of fruit and vegetables. Blood tests for lipids and cholesterol should begin at age 68 and be repeated every 5 years. If your lipid or cholesterol levels are high, you are over 50, or you are at high risk for heart disease, you may need your cholesterol levels checked more frequently.Ongoing high lipid and cholesterol levels should be treated with medicines if diet and exercise are not working.  If you smoke, find out from your health care provider how to quit. If you do not use tobacco, do not start.  Lung cancer screening is recommended for adults aged 58-80 years who are at high risk for developing lung cancer because of a history of smoking. A yearly low-dose CT scan of the lungs is recommended for people who have at least a 30-pack-year history of smoking and are a current smoker or have quit within the past 15 years. A pack year of smoking is smoking an average of 1 pack of cigarettes a day for 1 year (  for example: 1 pack a day for 30 years or 2 packs a day for 15 years). Yearly screening should continue until the smoker has stopped smoking for at least 15 years. Yearly screening should be stopped for people who develop a health problem that would prevent them from having lung cancer treatment.  If you are pregnant, do not drink alcohol. If you are breastfeeding, be very cautious about drinking alcohol. If you are not pregnant and choose to drink alcohol, do not have more than 1  drink per day. One drink is considered to be 12 ounces (355 mL) of beer, 5 ounces (148 mL) of wine, or 1.5 ounces (44 mL) of liquor.  Avoid use of street drugs. Do not share needles with anyone. Ask for help if you need support or instructions about stopping the use of drugs.  High blood pressure causes heart disease and increases the risk of stroke. Your blood pressure should be checked at least every 1 to 2 years. Ongoing high blood pressure should be treated with medicines if weight loss and exercise do not work.  If you are 28-30 years old, ask your health care provider if you should take aspirin to prevent strokes.  Diabetes screening is done by taking a blood sample to check your blood glucose level after you have not eaten for a certain period of time (fasting). If you are not overweight and you do not have risk factors for diabetes, you should be screened once every 3 years starting at age 65. If you are overweight or obese and you are 38-54 years of age, you should be screened for diabetes every year as part of your cardiovascular risk assessment.  Breast cancer screening is essential preventive care for women. You should practice "breast self-awareness." This means understanding the normal appearance and feel of your breasts and may include breast self-examination. Any changes detected, no matter how small, should be reported to a health care provider. Women in their 25s and 30s should have a clinical breast exam (CBE) by a health care provider as part of a regular health exam every 1 to 3 years. After age 23, women should have a CBE every year. Starting at age 33, women should consider having a mammogram (breast X-ray test) every year. Women who have a family history of breast cancer should talk to their health care provider about genetic screening. Women at a high risk of breast cancer should talk to their health care providers about having an MRI and a mammogram every year.  Breast cancer  gene (BRCA)-related cancer risk assessment is recommended for women who have family members with BRCA-related cancers. BRCA-related cancers include breast, ovarian, tubal, and peritoneal cancers. Having family members with these cancers may be associated with an increased risk for harmful changes (mutations) in the breast cancer genes BRCA1 and BRCA2. Results of the assessment will determine the need for genetic counseling and BRCA1 and BRCA2 testing.  Your health care provider may recommend that you be screened regularly for cancer of the pelvic organs (ovaries, uterus, and vagina). This screening involves a pelvic examination, including checking for microscopic changes to the surface of your cervix (Pap test). You may be encouraged to have this screening done every 3 years, beginning at age 84.  For women ages 42-65, health care providers may recommend pelvic exams and Pap testing every 3 years, or they may recommend the Pap and pelvic exam, combined with testing for human papilloma virus (HPV), every 5 years. Some types  of HPV increase your risk of cervical cancer. Testing for HPV may also be done on women of any age with unclear Pap test results.  Other health care providers may not recommend any screening for nonpregnant women who are considered low risk for pelvic cancer and who do not have symptoms. Ask your health care provider if a screening pelvic exam is right for you.  If you have had past treatment for cervical cancer or a condition that could lead to cancer, you need Pap tests and screening for cancer for at least 20 years after your treatment. If Pap tests have been discontinued, your risk factors (such as having a new sexual partner) need to be reassessed to determine if screening should resume. Some women have medical problems that increase the chance of getting cervical cancer. In these cases, your health care provider may recommend more frequent screening and Pap tests.  Colorectal  cancer can be detected and often prevented. Most routine colorectal cancer screening begins at the age of 78 years and continues through age 12 years. However, your health care provider may recommend screening at an earlier age if you have risk factors for colon cancer. On a yearly basis, your health care provider may provide home test kits to check for hidden blood in the stool. Use of a small camera at the end of a tube, to directly examine the colon (sigmoidoscopy or colonoscopy), can detect the earliest forms of colorectal cancer. Talk to your health care provider about this at age 86, when routine screening begins. Direct exam of the colon should be repeated every 5-10 years through age 59 years, unless early forms of precancerous polyps or small growths are found.  People who are at an increased risk for hepatitis B should be screened for this virus. You are considered at high risk for hepatitis B if:  You were born in a country where hepatitis B occurs often. Talk with your health care provider about which countries are considered high risk.  Your parents were born in a high-risk country and you have not received a shot to protect against hepatitis B (hepatitis B vaccine).  You have HIV or AIDS.  You use needles to inject street drugs.  You live with, or have sex with, someone who has hepatitis B.  You get hemodialysis treatment.  You take certain medicines for conditions like cancer, organ transplantation, and autoimmune conditions.  Hepatitis C blood testing is recommended for all people born from [birth year] through 1965 and any individual with known risks for hepatitis C.  Practice safe sex. Use condoms and avoid high-risk sexual practices to reduce the spread of sexually transmitted infections (STIs). STIs include gonorrhea, chlamydia, syphilis, trichomonas, herpes, HPV, and human immunodeficiency virus (HIV). Herpes, HIV, and HPV are viral illnesses that have no cure. They can result in  disability, cancer, and death.  You should be screened for sexually transmitted illnesses (STIs) including gonorrhea and chlamydia if:  You are sexually active and are younger than 24 years.  You are older than 24 years and your health care provider tells you that you are at risk for this type of infection.  Your sexual activity has changed since you were last screened and you are at an increased risk for chlamydia or gonorrhea. Ask your health care provider if you are at risk.  If you are at risk of being infected with HIV, it is recommended that you take a prescription medicine daily to prevent HIV infection. This is called  preexposure prophylaxis (PrEP). You are considered at risk if:  You are sexually active and do not regularly use condoms or know the HIV status of your partner(s).  You take drugs by injection.  You are sexually active with a partner who has HIV.  Talk with your health care provider about whether you are at high risk of being infected with HIV. If you choose to begin PrEP, you should first be tested for HIV. You should then be tested every 3 months for as long as you are taking PrEP.  Osteoporosis is a disease in which the bones lose minerals and strength with aging. This can result in serious bone fractures or breaks. The risk of osteoporosis can be identified using a bone density scan. Women ages 21 years and over and women at risk for fractures or osteoporosis should discuss screening with their health care providers. Ask your health care provider whether you should take a calcium supplement or vitamin D to reduce the rate of osteoporosis.  Menopause can be associated with physical symptoms and risks. Hormone replacement therapy is available to decrease symptoms and risks. You should talk to your health care provider about whether hormone replacement therapy is right for you.  Use sunscreen. Apply sunscreen liberally and repeatedly throughout the day. You should seek  shade when your shadow is shorter than you. Protect yourself by wearing long sleeves, pants, a wide-brimmed hat, and sunglasses year round, whenever you are outdoors.  Once a month, do a whole body skin exam, using a mirror to look at the skin on your back. Tell your health care provider of new moles, moles that have irregular borders, moles that are larger than a pencil eraser, or moles that have changed in shape or color.  Stay current with required vaccines (immunizations).  Influenza vaccine. All adults should be immunized every year.  Tetanus, diphtheria, and acellular pertussis (Td, Tdap) vaccine. Pregnant women should receive 1 dose of Tdap vaccine during each pregnancy. The dose should be obtained regardless of the length of time since the last dose. Immunization is preferred during the 27th-36th week of gestation. An adult who has not previously received Tdap or who does not know her vaccine status should receive 1 dose of Tdap. This initial dose should be followed by tetanus and diphtheria toxoids (Td) booster doses every 10 years. Adults with an unknown or incomplete history of completing a 3-dose immunization series with Td-containing vaccines should begin or complete a primary immunization series including a Tdap dose. Adults should receive a Td booster every 10 years.  Varicella vaccine. An adult without evidence of immunity to varicella should receive 2 doses or a second dose if she has previously received 1 dose. Pregnant females who do not have evidence of immunity should receive the first dose after pregnancy. This first dose should be obtained before leaving the health care facility. The second dose should be obtained 4-8 weeks after the first dose.  Human papillomavirus (HPV) vaccine. Females aged 13-26 years who have not received the vaccine previously should obtain the 3-dose series. The vaccine is not recommended for use in pregnant females. However, pregnancy testing is not needed  before receiving a dose. If a female is found to be pregnant after receiving a dose, no treatment is needed. In that case, the remaining doses should be delayed until after the pregnancy. Immunization is recommended for any person with an immunocompromised condition through the age of 23 years if she did not get any or all  doses earlier. During the 3-dose series, the second dose should be obtained 4-8 weeks after the first dose. The third dose should be obtained 24 weeks after the first dose and 16 weeks after the second dose.  Zoster vaccine. One dose is recommended for adults aged 60 years or older unless certain conditions are present.  Measles, mumps, and rubella (MMR) vaccine. Adults born before 1957 generally are considered immune to measles and mumps. Adults born in 1957 or later should have 1 or more doses of MMR vaccine unless there is a contraindication to the vaccine or there is laboratory evidence of immunity to each of the three diseases. A routine second dose of MMR vaccine should be obtained at least 28 days after the first dose for students attending postsecondary schools, health care workers, or international travelers. People who received inactivated measles vaccine or an unknown type of measles vaccine during 1963-1967 should receive 2 doses of MMR vaccine. People who received inactivated mumps vaccine or an unknown type of mumps vaccine before 1979 and are at high risk for mumps infection should consider immunization with 2 doses of MMR vaccine. For females of childbearing age, rubella immunity should be determined. If there is no evidence of immunity, females who are not pregnant should be vaccinated. If there is no evidence of immunity, females who are pregnant should delay immunization until after pregnancy. Unvaccinated health care workers born before 1957 who lack laboratory evidence of measles, mumps, or rubella immunity or laboratory confirmation of disease should consider measles and  mumps immunization with 2 doses of MMR vaccine or rubella immunization with 1 dose of MMR vaccine.  Pneumococcal 13-valent conjugate (PCV13) vaccine. When indicated, a person who is uncertain of his immunization history and has no record of immunization should receive the PCV13 vaccine. All adults 65 years of age and older should receive this vaccine. An adult aged 19 years or older who has certain medical conditions and has not been previously immunized should receive 1 dose of PCV13 vaccine. This PCV13 should be followed with a dose of pneumococcal polysaccharide (PPSV23) vaccine. Adults who are at high risk for pneumococcal disease should obtain the PPSV23 vaccine at least 8 weeks after the dose of PCV13 vaccine. Adults older than 50 years of age who have normal immune system function should obtain the PPSV23 vaccine dose at least 1 year after the dose of PCV13 vaccine.  Pneumococcal polysaccharide (PPSV23) vaccine. When PCV13 is also indicated, PCV13 should be obtained first. All adults aged 65 years and older should be immunized. An adult younger than age 65 years who has certain medical conditions should be immunized. Any person who resides in a nursing home or long-term care facility should be immunized. An adult smoker should be immunized. People with an immunocompromised condition and certain other conditions should receive both PCV13 and PPSV23 vaccines. People with human immunodeficiency virus (HIV) infection should be immunized as soon as possible after diagnosis. Immunization during chemotherapy or radiation therapy should be avoided. Routine use of PPSV23 vaccine is not recommended for American Indians, [place] Natives, or people younger than 65 years unless there are medical conditions that require PPSV23 vaccine. When indicated, people who have unknown immunization and have no record of immunization should receive PPSV23 vaccine. One-time revaccination 5 years after the first dose of PPSV23 is  recommended for people aged 19-64 years who have chronic kidney failure, nephrotic syndrome, asplenia, or immunocompromised conditions. People who received 1-2 doses of PPSV23 before age 65 years should receive   another dose of PPSV23 vaccine at age 44 years or later if at least 5 years have passed since the previous dose. Doses of PPSV23 are not needed for people immunized with PPSV23 at or after age 33 years.  Meningococcal vaccine. Adults with asplenia or persistent complement component deficiencies should receive 2 doses of quadrivalent meningococcal conjugate (MenACWY-D) vaccine. The doses should be obtained at least 2 months apart. Microbiologists working with certain meningococcal bacteria, [employer] recruits, people at risk during an outbreak, and people who travel to or live in countries with a high rate of meningitis should be immunized. A first-year college student up through age 46 years who is living in a residence hall should receive a dose if she did not receive a dose on or after her 16th birthday. Adults who have certain high-risk conditions should receive one or more doses of vaccine.  Hepatitis A vaccine. Adults who wish to be protected from this disease, have certain high-risk conditions, work with hepatitis A-infected animals, work in hepatitis A research labs, or travel to or work in countries with a high rate of hepatitis A should be immunized. Adults who were previously unvaccinated and who anticipate close contact with an international adoptee during the first 60 days after arrival in the [place] from a country with a high rate of hepatitis A should be immunized.  Hepatitis B vaccine. Adults who wish to be protected from this disease, have certain high-risk conditions, may be exposed to blood or other infectious body fluids, are household contacts or sex partners of hepatitis B positive people, are clients or workers in certain care facilities, or travel to or work in countries  with a high rate of hepatitis B should be immunized.  Haemophilus influenzae type b (Hib) vaccine. A previously unvaccinated person with asplenia or sickle cell disease or having a scheduled splenectomy should receive 1 dose of Hib vaccine. Regardless of previous immunization, a recipient of a hematopoietic stem cell transplant should receive a 3-dose series 6-12 months after her successful transplant. Hib vaccine is not recommended for adults with HIV infection. Preventive Services / Frequency Ages 24 to 12 years  Blood pressure check.** / Every 3-5 years.  Lipid and cholesterol check.** / Every 5 years beginning at age 49.  Clinical breast exam.** / Every 3 years for women in their 66s and 11s.  BRCA-related cancer risk assessment.** / For women who have family members with a BRCA-related cancer (breast, ovarian, tubal, or peritoneal cancers).  Pap test.** / Every 2 years from ages 34 through 94. Every 3 years starting at age 52 through age 56 or 82 with a history of 3 consecutive normal Pap tests.  HPV screening.** / Every 3 years from ages 80 through ages 63 to 8 with a history of 3 consecutive normal Pap tests.  Hepatitis C blood test.** / For any individual with known risks for hepatitis C.  Skin self-exam. / Monthly.  Influenza vaccine. / Every year.  Tetanus, diphtheria, and acellular pertussis (Tdap, Td) vaccine.** / Consult your health care provider. Pregnant women should receive 1 dose of Tdap vaccine during each pregnancy. 1 dose of Td every 10 years.  Varicella vaccine.** / Consult your health care provider. Pregnant females who do not have evidence of immunity should receive the first dose after pregnancy.  HPV vaccine. / 3 doses over 6 months, if 16 and younger. The vaccine is not recommended for use in pregnant females. However, pregnancy testing is not needed before receiving a dose.  Measles, mumps, rubella (MMR) vaccine.** / You need at least 1 dose of MMR if you  were born in 1957 or later. You may also need a 2nd dose. For females of childbearing age, rubella immunity should be determined. If there is no evidence of immunity, females who are not pregnant should be vaccinated. If there is no evidence of immunity, females who are pregnant should delay immunization until after pregnancy.  Pneumococcal 13-valent conjugate (PCV13) vaccine.** / Consult your health care provider.  Pneumococcal polysaccharide (PPSV23) vaccine.** / 1 to 2 doses if you smoke cigarettes or if you have certain conditions.  Meningococcal vaccine.** / 1 dose if you are age 52 to 43 years and a Market researcher living in a residence hall, or have one of several medical conditions, you need to get vaccinated against meningococcal disease. You may also need additional booster doses.  Hepatitis A vaccine.** / Consult your health care provider.  Hepatitis B vaccine.** / Consult your health care provider.  Haemophilus influenzae type b (Hib) vaccine.** / Consult your health care provider. Ages 74 to 6 years  Blood pressure check.** / Every year.  Lipid and cholesterol check.** / Every 5 years beginning at age 77 years.  Lung cancer screening. / Every year if you are aged 55-80 years and have a 30-pack-year history of smoking and currently smoke or have quit within the past 15 years. Yearly screening is stopped once you have quit smoking for at least 15 years or develop a health problem that would prevent you from having lung cancer treatment.  Clinical breast exam.** / Every year after age 44 years.  BRCA-related cancer risk assessment.** / For women who have family members with a BRCA-related cancer (breast, ovarian, tubal, or peritoneal cancers).  Mammogram.** / Every year beginning at age 7 years and continuing for as long as you are in good health. Consult with your health care provider.  Pap test.** / Every 3 years starting at age 46 years through age 34 or 29 years  with a history of 3 consecutive normal Pap tests.  HPV screening.** / Every 3 years from ages 64 years through ages 60 to 41 years with a history of 3 consecutive normal Pap tests.  Fecal occult blood test (FOBT) of stool. / Every year beginning at age 73 years and continuing until age 39 years. You may not need to do this test if you get a colonoscopy every 10 years.  Flexible sigmoidoscopy or colonoscopy.** / Every 5 years for a flexible sigmoidoscopy or every 10 years for a colonoscopy beginning at age 67 years and continuing until age 69 years.  Hepatitis C blood test.** / For all people born from [birth year] through 1965 and any individual with known risks for hepatitis C.  Skin self-exam. / Monthly.  Influenza vaccine. / Every year.  Tetanus, diphtheria, and acellular pertussis (Tdap/Td) vaccine.** / Consult your health care provider. Pregnant women should receive 1 dose of Tdap vaccine during each pregnancy. 1 dose of Td every 10 years.  Varicella vaccine.** / Consult your health care provider. Pregnant females who do not have evidence of immunity should receive the first dose after pregnancy.  Zoster vaccine.** / 1 dose for adults aged 31 years or older.  Measles, mumps, rubella (MMR) vaccine.** / You need at least 1 dose of MMR if you were born in 1957 or later. You may also need a second dose. For females of childbearing age, rubella immunity should be determined. If there is no  evidence of immunity, females who are not pregnant should be vaccinated. If there is no evidence of immunity, females who are pregnant should delay immunization until after pregnancy.  Pneumococcal 13-valent conjugate (PCV13) vaccine.** / Consult your health care provider.  Pneumococcal polysaccharide (PPSV23) vaccine.** / 1 to 2 doses if you smoke cigarettes or if you have certain conditions.  Meningococcal vaccine.** / Consult your health care provider.  Hepatitis A vaccine.** / Consult your health care  provider.  Hepatitis B vaccine.** / Consult your health care provider.  Haemophilus influenzae type b (Hib) vaccine.** / Consult your health care provider. Ages 12 years and over  Blood pressure check.** / Every year.  Lipid and cholesterol check.** / Every 5 years beginning at age 70 years.  Lung cancer screening. / Every year if you are aged 56-80 years and have a 30-pack-year history of smoking and currently smoke or have quit within the past 15 years. Yearly screening is stopped once you have quit smoking for at least 15 years or develop a health problem that would prevent you from having lung cancer treatment.  Clinical breast exam.** / Every year after age 73 years.  BRCA-related cancer risk assessment.** / For women who have family members with a BRCA-related cancer (breast, ovarian, tubal, or peritoneal cancers).  Mammogram.** / Every year beginning at age 11 years and continuing for as long as you are in good health. Consult with your health care provider.  Pap test.** / Every 3 years starting at age 73 years through age 53 or 59 years with 3 consecutive normal Pap tests. Testing can be stopped between 65 and 70 years with 3 consecutive normal Pap tests and no abnormal Pap or HPV tests in the past 10 years.  HPV screening.** / Every 3 years from ages 50 years through ages 65 or 75 years with a history of 3 consecutive normal Pap tests. Testing can be stopped between 65 and 70 years with 3 consecutive normal Pap tests and no abnormal Pap or HPV tests in the past 10 years.  Fecal occult blood test (FOBT) of stool. / Every year beginning at age 65 years and continuing until age 33 years. You may not need to do this test if you get a colonoscopy every 10 years.  Flexible sigmoidoscopy or colonoscopy.** / Every 5 years for a flexible sigmoidoscopy or every 10 years for a colonoscopy beginning at age 49 years and continuing until age 89 years.  Hepatitis C blood test.** / For all  people born from [birth year] through 1965 and any individual with known risks for hepatitis C.  Osteoporosis screening.** / A one-time screening for women ages 2 years and over and women at risk for fractures or osteoporosis.  Skin self-exam. / Monthly.  Influenza vaccine. / Every year.  Tetanus, diphtheria, and acellular pertussis (Tdap/Td) vaccine.** / 1 dose of Td every 10 years.  Varicella vaccine.** / Consult your health care provider.  Zoster vaccine.** / 1 dose for adults aged 36 years or older.  Pneumococcal 13-valent conjugate (PCV13) vaccine.** / Consult your health care provider.  Pneumococcal polysaccharide (PPSV23) vaccine.** / 1 dose for all adults aged 109 years and older.  Meningococcal vaccine.** / Consult your health care provider.  Hepatitis A vaccine.** / Consult your health care provider.  Hepatitis B vaccine.** / Consult your health care provider.  Haemophilus influenzae type b (Hib) vaccine.** / Consult your health care provider. ** Family history and personal history of risk and conditions may change your health  care provider's recommendations.   This information is not intended to replace advice given to you by your health care provider. Make sure you discuss any questions you have with your health care provider.   Document Released: 06/29/2001 Document Revised: 05/24/2014 Document Reviewed: 09/28/2010 Elsevier Interactive Patient Education 2016 [employer] .      

## 2015-09-12 NOTE — Assessment & Plan Note (Signed)
Depression screen negative. Health Maintenance reviewed -- immunizations up-to-date. Screening mammogram ordered. Patient s/p hysterectomy. No PAP today.  Preventive schedule discussed and handout given in AVS. Will obtain fasting labs today.

## 2015-09-12 NOTE — Assessment & Plan Note (Signed)
Is working on diet and exercise regimen. Has already lost substantial amount of weight. Will continue to monitor.

## 2015-09-12 NOTE — Progress Notes (Signed)
Patient presents to clinic today for annual exam.  Patient is fasting for labs.  Acute Concerns: Denies acute concerns today.  Chronic Issues: Constipation -- Chronic. High fiber diet with good hydration. Is not on any constipation-inducing medications. Can go up to 3-4 days without a bowel movement. Denies tenesmus, melena or hematochezia. Has recently started a probiotic [employer]).  Body mass index is 29.53 kg/(m^2). Is working on diet and is exercising daily. Has lost 30 pounds over the past several months.   Health Maintenance: Immunizations -- up-to-date Mammogram -- Needs to be scheduled. Last 1 year ago. PAP -- s/p hysterectomy    Past Medical History  Diagnosis Date  . Impingement syndrome of right shoulder   . Cervical disc disease   . Elevated blood pressure (not hypertension)     Past Surgical History  Procedure Laterality Date  . Tonsillectomy  age 61  . Vaginal hysterectomy  03-09-2000    W/ LEFT SALPINGOOPHECTOMY  . Shoulder surgery  2013    Right, Bursa  . Neck surgery  07/23/2014    Cervical Disk    Current Outpatient Prescriptions on File Prior to Visit  Medication Sig Dispense Refill  . Nutritional Supplements (JUICE PLUS FIBRE PO) Take 2 capsules by mouth daily. 2 Berries, 2 Vegs, 2 Fruits     No current facility-administered medications on file prior to visit.    No Known Allergies  Family History  Problem Relation Age of Onset  . Heart disease Father     Living  . Fibromyalgia Mother   . Allergies Brother   . Healthy Son     x2  . Polycystic ovary syndrome Daughter     x1    Social History   Social History  . Marital Status: Divorced    Spouse Name: N/A  . Number of Children: N/A  . Years of Education: N/A   Occupational History  .      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 Smoking status: Never Smoker   . Smokeless tobacco: Never Used  . Alcohol Use: 0.0 oz/week    0 Standard drinks or equivalent  per week     Comment: rare  . Drug Use: No  . Sexual Activity: Yes    Birth Control/ Protection: Surgical   Other Topics Concern  . Not on file   Social History Narrative   Review of Systems  Constitutional: Negative for fever and weight loss.  HENT: Negative for ear discharge, ear pain, hearing loss and tinnitus.   Eyes: Negative for blurred vision, double vision, photophobia and pain.  Respiratory: Negative for cough and shortness of breath.   Cardiovascular: Negative for chest pain and palpitations.  Gastrointestinal: Negative for heartburn, nausea, vomiting, abdominal pain, diarrhea, constipation, blood in stool and melena.  Genitourinary: Negative for dysuria, urgency, frequency, hematuria and flank pain.  Musculoskeletal: Negative for falls.  Neurological: Negative for dizziness, loss of consciousness and headaches.  Endo/Heme/Allergies: Negative for environmental allergies.  Psychiatric/Behavioral: Negative for depression, suicidal ideas, hallucinations and substance abuse. The patient is not nervous/anxious and does not have insomnia.     BP 120/86 mmHg  Pulse 80  Temp(Src) 98 F (36.7 C) (Oral)  Resp 16  Ht  (1.575 m)  Wt 161 lb 8 oz (73.256 kg)  BMI 29.53 kg/m2  SpO2 99%  Physical Exam  Constitutional: She is oriented to person, place, and time and well-developed, well-nourished, and in no distress.  HENT:  Head: Normocephalic and atraumatic.  Right Ear: Tympanic membrane, external ear and ear canal normal.  Left Ear: Tympanic membrane, external ear and ear canal normal.  Nose: Nose normal. No mucosal edema.  Mouth/Throat: Uvula is midline, oropharynx is clear and moist and mucous membranes are normal. No oropharyngeal exudate or posterior oropharyngeal erythema.  Eyes: Conjunctivae are normal. Pupils are equal, round, and reactive to light.  Neck: Neck supple. No thyromegaly present.  Cardiovascular: Normal rate, regular rhythm, normal heart sounds and  intact distal pulses.   Pulmonary/Chest: Effort normal and breath sounds normal. No respiratory distress. She has no wheezes. She has no rales.  Abdominal: Soft. Bowel sounds are normal. She exhibits no distension and no mass. There is no tenderness. There is no rebound and no guarding.  Lymphadenopathy:    She has no cervical adenopathy.  Neurological: She is alert and oriented to person, place, and time. No cranial nerve deficit.  Skin: Skin is warm and dry. No rash noted.  Psychiatric: Affect normal.  Vitals reviewed.  No results found for this or any previous visit (from the past 2160 hour(s)).  Assessment/Plan: Visit for preventive health examination Depression screen negative. Health Maintenance reviewed -- immunizations up-to-date. Screening mammogram ordered. Patient s/p hysterectomy. No PAP today.  Preventive schedule discussed and handout given in AVS. Will obtain fasting labs today.   Screening for breast cancer Order for screening mammogram placed.  Body mass index 29.0-29.9, adult Is working on diet and exercise regimen. Has already lost substantial amount of weight. Will continue to monitor.

## 2015-09-12 NOTE — Progress Notes (Signed)
Pre visit review using our clinic review tool, if applicable. No additional management support is needed unless otherwise documented below in the visit note/SLS  

## 2015-09-12 NOTE — Assessment & Plan Note (Signed)
Order for screening mammogram placed.  

## 2015-09-16 ENCOUNTER — Ambulatory Visit (HOSPITAL_BASED_OUTPATIENT_CLINIC_OR_DEPARTMENT_OTHER)
Admission: RE | Admit: 2015-09-16 | Discharge: 2015-09-16 | Disposition: A | Discharge status: Home / Self Care | Payer: Managed Care, Other (non HMO) | Source: Ambulatory Visit | Attending: Physician Assistant | Admitting: Physician Assistant

## 2015-09-16 DIAGNOSIS — Z1231 Encounter for screening mammogram for malignant neoplasm of breast: Secondary | ICD-10-CM | POA: Diagnosis not present

## 2015-09-16 DIAGNOSIS — R928 Other abnormal and inconclusive findings on diagnostic imaging of breast: Secondary | ICD-10-CM | POA: Diagnosis not present

## 2015-09-16 DIAGNOSIS — Z1239 Encounter for other screening for malignant neoplasm of breast: Secondary | ICD-10-CM | POA: Insufficient documentation

## 2015-09-18 ENCOUNTER — Other Ambulatory Visit: Payer: Self-pay | Admitting: Physician Assistant

## 2015-09-18 DIAGNOSIS — R928 Other abnormal and inconclusive findings on diagnostic imaging of breast: Secondary | ICD-10-CM

## 2015-09-26 ENCOUNTER — Ambulatory Visit
Admission: RE | Admit: 2015-09-26 | Discharge: 2015-09-26 | Disposition: A | Discharge status: Home / Self Care | Payer: Managed Care, Other (non HMO) | Source: Ambulatory Visit | Attending: Physician Assistant | Admitting: Physician Assistant

## 2015-09-26 DIAGNOSIS — R928 Other abnormal and inconclusive findings on diagnostic imaging of breast: Secondary | ICD-10-CM

## 2015-11-30 ENCOUNTER — Emergency Department (HOSPITAL_BASED_OUTPATIENT_CLINIC_OR_DEPARTMENT_OTHER)
Admission: EM | Admit: 2015-11-30 | Discharge: 2015-11-30 | Disposition: A | Discharge status: Home / Self Care | Payer: Managed Care, Other (non HMO) | Attending: Emergency Medicine | Admitting: Emergency Medicine

## 2015-11-30 ENCOUNTER — Emergency Department (HOSPITAL_BASED_OUTPATIENT_CLINIC_OR_DEPARTMENT_OTHER): Payer: Managed Care, Other (non HMO)

## 2015-11-30 ENCOUNTER — Encounter (HOSPITAL_BASED_OUTPATIENT_CLINIC_OR_DEPARTMENT_OTHER): Payer: Self-pay | Admitting: *Deleted

## 2015-11-30 DIAGNOSIS — Y9302 Activity, running: Secondary | ICD-10-CM | POA: Insufficient documentation

## 2015-11-30 DIAGNOSIS — W1839XA Other fall on same level, initial encounter: Secondary | ICD-10-CM | POA: Diagnosis not present

## 2015-11-30 DIAGNOSIS — Y929 Unspecified place or not applicable: Secondary | ICD-10-CM | POA: Diagnosis not present

## 2015-11-30 DIAGNOSIS — Y999 Unspecified external cause status: Secondary | ICD-10-CM | POA: Diagnosis not present

## 2015-11-30 DIAGNOSIS — M25571 Pain in right ankle and joints of right foot: Secondary | ICD-10-CM | POA: Diagnosis not present

## 2015-11-30 MED ORDER — IBUPROFEN 800 MG PO TABS: 800.0000 mg | ORAL_TABLET | Freq: Once | ORAL | Status: DC

## 2015-11-30 NOTE — Discharge Instructions (Signed)
Your blood pressure was high today. Follow up with your PCP for a blood pressure recheck this week.  Acute Ankle Sprain With Phase I Rehab An acute ankle sprain is a partial or complete tear in one or more of the ligaments of the ankle due to traumatic injury. The severity of the injury depends on both the number of ligaments sprained and the grade of sprain. There are 3 grades of sprains.   A grade 1 sprain is a mild sprain. There is a slight pull without obvious tearing. There is no loss of strength, and the muscle and ligament are the correct length.  A grade 2 sprain is a moderate sprain. There is tearing of fibers within the substance of the ligament where it connects two bones or two cartilages. The length of the ligament is increased, and there is usually decreased strength.  A grade 3 sprain is a complete rupture of the ligament and is uncommon. In addition to the grade of sprain, there are three types of ankle sprains.  Lateral ankle sprains: This is a sprain of one or more of the three ligaments on the outer side (lateral) of the ankle. These are the most common sprains. Medial ankle sprains: There is one large triangular ligament of the inner side (medial) of the ankle that is susceptible to injury. Medial ankle sprains are less common. Syndesmosis, "high ankle," sprains: The syndesmosis is the ligament that connects the two bones of the lower leg. Syndesmosis sprains usually only occur with very severe ankle sprains. SYMPTOMS  Pain, tenderness, and swelling in the ankle, starting at the side of injury that may progress to the whole ankle and foot with time.  "Pop" or tearing sensation at the time of injury.  Bruising that may spread to the heel.  Impaired ability to walk soon after injury. CAUSES   Acute ankle sprains are caused by trauma placed on the ankle that temporarily forces or pries the anklebone (talus) out of its normal socket.  Stretching or tearing of the ligaments  that normally hold the joint in place (usually due to a twisting injury). RISK INCREASES WITH:  Previous ankle sprain.  Sports in which the foot may land awkwardly (i.e., basketball, volleyball, or soccer) or walking or running on uneven or rough surfaces.  Shoes with inadequate support to prevent sideways motion when stress occurs.  Poor strength and flexibility.  Poor balance skills.  Contact sports. PREVENTION   Warm up and stretch properly before activity.  Maintain physical fitness:  Ankle and leg flexibility, muscle strength, and endurance.  Cardiovascular fitness.  Balance training activities.  Use proper technique and have a coach correct improper technique.  Taping, protective strapping, bracing, or high-top tennis shoes may help prevent injury. Initially, tape is best; however, it loses most of its support function within 10 to 15 minutes.  Wear proper-fitted protective shoes (High-top shoes with taping or bracing is more effective than either alone).  Provide the ankle with support during sports and practice activities for 12 months following injury. PROGNOSIS   If treated properly, ankle sprains can be expected to recover completely; however, the length of recovery depends on the degree of injury.  A grade 1 sprain usually heals enough in 5 to 7 days to allow modified activity and requires an average of 6 weeks to heal completely.  A grade 2 sprain requires 6 to 10 weeks to heal completely.  A grade 3 sprain requires 12 to 16 weeks to heal.  A  syndesmosis sprain often takes more than 3 months to heal. RELATED COMPLICATIONS   Frequent recurrence of symptoms may result in a chronic problem. Appropriately addressing the problem the first time decreases the frequency of recurrence and optimizes healing time. Severity of the initial sprain does not predict the likelihood of later instability.  Injury to other structures (bone, cartilage, or tendon).  A  chronically unstable or arthritic ankle joint is a possibility with repeated sprains. TREATMENT Treatment initially involves the use of ice, medication, and compression bandages to help reduce pain and inflammation. Ankle sprains are usually immobilized in a walking cast or boot to allow for healing. Crutches may be recommended to reduce pressure on the injury. After immobilization, strengthening and stretching exercises may be necessary to regain strength and a full range of motion. Surgery is rarely needed to treat ankle sprains. MEDICATION   Nonsteroidal anti-inflammatory medications, such as aspirin and ibuprofen (do not take for the first 3 days after injury or within 7 days before surgery), or other minor pain relievers, such as acetaminophen, are often recommended. Take these as directed by your caregiver. Contact your caregiver immediately if any bleeding, stomach upset, or signs of an allergic reaction occur from these medications.  Ointments applied to the skin may be helpful.  Pain relievers may be prescribed as necessary by your caregiver. Do not take prescription pain medication for longer than 4 to 7 days. Use only as directed and only as much as you need. HEAT AND COLD  Cold treatment (icing) is used to relieve pain and reduce inflammation for acute and chronic cases. Cold should be applied for 10 to 15 minutes every 2 to 3 hours for inflammation and pain and immediately after any activity that aggravates your symptoms. Use ice packs or an ice massage.  Heat treatment may be used before performing stretching and strengthening activities prescribed by your caregiver. Use a heat pack or a warm soak. SEEK IMMEDIATE MEDICAL CARE IF:   Pain, swelling, or bruising worsens despite treatment.  You experience pain, numbness, discoloration, or coldness in the foot or toes.  New, unexplained symptoms develop (drugs used in treatment may produce side effects.) EXERCISES  PHASE I  EXERCISES RANGE OF MOTION (ROM) AND STRETCHING EXERCISES - Ankle Sprain, Acute Phase I, Weeks 1 to 2 These exercises may help you when beginning to restore flexibility in your ankle. You will likely work on these exercises for the 1 to 2 weeks after your injury. Once your physician, physical therapist, or athletic trainer sees adequate progress, he or she will advance your exercises. While completing these exercises, remember:   Restoring tissue flexibility helps normal motion to return to the joints. This allows healthier, less painful movement and activity.  An effective stretch should be held for at least 30 seconds.  A stretch should never be painful. You should only feel a gentle lengthening or release in the stretched tissue. RANGE OF MOTION - Dorsi/Plantar Flexion  While sitting with your right / left knee straight, draw the top of your foot upwards by flexing your ankle. Then reverse the motion, pointing your toes downward.  Hold each position for __________ seconds.  After completing your first set of exercises, repeat this exercise with your knee bent. Repeat __________ times. Complete this exercise __________ times per day.  RANGE OF MOTION - Ankle Alphabet  Imagine your right / left big toe is a pen.  Keeping your hip and knee still, write out the entire alphabet with your "pen."  Make the letters as large as you can without increasing any discomfort. Repeat __________ times. Complete this exercise __________ times per day.  STRENGTHENING EXERCISES - Ankle Sprain, Acute -Phase I, Weeks 1 to 2 These exercises may help you when beginning to restore strength in your ankle. You will likely work on these exercises for 1 to 2 weeks after your injury. Once your physician, physical therapist, or athletic trainer sees adequate progress, he or she will advance your exercises. While completing these exercises, remember:   Muscles can gain both the endurance and the strength needed for  everyday activities through controlled exercises.  Complete these exercises as instructed by your physician, physical therapist, or athletic trainer. Progress the resistance and repetitions only as guided.  You may experience muscle soreness or fatigue, but the pain or discomfort you are trying to eliminate should never worsen during these exercises. If this pain does worsen, stop and make certain you are following the directions exactly. If the pain is still present after adjustments, discontinue the exercise until you can discuss the trouble with your clinician. STRENGTH - Dorsiflexors  Secure a rubber exercise band/tubing to a fixed object (i.e., table, pole) and loop the other end around your right / left foot.  Sit on the floor facing the fixed object. The band/tubing should be slightly tense when your foot is relaxed.  Slowly draw your foot back toward you using your ankle and toes.  Hold this position for __________ seconds. Slowly release the tension in the band and return your foot to the starting position. Repeat __________ times. Complete this exercise __________ times per day.  STRENGTH - Plantar-flexors   Sit with your right / left leg extended. Holding onto both ends of a rubber exercise band/tubing, loop it around the ball of your foot. Keep a slight tension in the band.  Slowly push your toes away from you, pointing them downward.  Hold this position for __________ seconds. Return slowly, controlling the tension in the band/tubing. Repeat __________ times. Complete this exercise __________ times per day.  STRENGTH - Ankle Eversion  Secure one end of a rubber exercise band/tubing to a fixed object (table, pole). Loop the other end around your foot just before your toes.  Place your fists between your knees. This will focus your strengthening at your ankle.  Drawing the band/tubing across your opposite foot, slowly, pull your little toe out and up. Make sure the band/tubing  is positioned to resist the entire motion.  Hold this position for __________ seconds. Have your muscles resist the band/tubing as it slowly pulls your foot back to the starting position.  Repeat __________ times. Complete this exercise __________ times per day.  STRENGTH - Ankle Inversion  Secure one end of a rubber exercise band/tubing to a fixed object (table, pole). Loop the other end around your foot just before your toes.  Place your fists between your knees. This will focus your strengthening at your ankle.  Slowly, pull your big toe up and in, making sure the band/tubing is positioned to resist the entire motion.  Hold this position for __________ seconds.  Have your muscles resist the band/tubing as it slowly pulls your foot back to the starting position. Repeat __________ times. Complete this exercises __________ times per day.  STRENGTH - Towel Curls  Sit in a chair positioned on a non-carpeted surface.  Place your right / left foot on a towel, keeping your heel on the floor.  Pull the towel toward your heel  by only curling your toes. Keep your heel on the floor.  If instructed by your physician, physical therapist, or athletic trainer, add weight to the end of the towel. Repeat __________ times. Complete this exercise __________ times per day.   This information is not intended to replace advice given to you by your health care provider. Make sure you discuss any questions you have with your health care provider.   Document Released: 12/02/2004 Document Revised: 05/24/2014 Document Reviewed: 08/15/2008 Elsevier Interactive Patient Education [employer] Inc[employer]

## 2015-11-30 NOTE — ED Notes (Signed)
Right ankle pain with swelling since falling while running today.

## 2015-11-30 NOTE — ED Provider Notes (Signed)
CSN: [id number]     Arrival date & time 11/30/15  1213 History   First MD Initiated Contact with Patient 11/30/15 1333     Chief Complaint  Patient presents with  . Ankle Pain   Patient is a 50 y.o. female presenting with ankle pain.  Ankle Pain Location:  Ankle Injury: yes   Mechanism of injury: fall   Fall:    Fall occurred:  Running   Impact surface:  Dirt Ankle location:  R ankle Pain details:    Radiates to:  Does not radiate   Severity:  Moderate   Onset quality:  Sudden   Timing:  Constant   Progression:  Unchanged Relieved by:  Immobilization Worsened by:  Bearing weight Ineffective treatments:  None tried Associated symptoms: swelling   Associated symptoms: no decreased ROM, no muscle weakness, no numbness and no tingling    Karen Gregory is a 50 year old female presenting with ankle pain. Patient states she was Trail running earlier today when she fell. She reports twisting her ankle underneath her. She had acute onset of severe lateral ankle pain and swelling. The pain does not radiate into the lower leg. The pain is exacerbated by weightbearing and inversion/eversion. She retains full range of motion of the ankle that is painful. She ambulated after the fall but states it was extremely painful. Denies numbness or tingling of the foot. Denies open wounds. She has not taken any medications prior to arrival. No other complaints today.  Past Medical History  Diagnosis Date  . Impingement syndrome of right shoulder   . Cervical disc disease   . Elevated blood pressure (not hypertension)    Past Surgical History  Procedure Laterality Date  . Tonsillectomy  age 50  . Vaginal hysterectomy  03-09-2000    W/ LEFT SALPINGOOPHECTOMY  . Shoulder surgery  2013    Right, Bursa  . Neck surgery  07/23/2014    Cervical Disk   Family History  Problem Relation Age of Onset  . Heart disease Father     Living  . Fibromyalgia Mother   . Allergies Brother   . Healthy Son     x2    . Polycystic ovary syndrome Daughter     x1   Social History  Substance Use Topics  . Smoking status: Never Smoker   . Smokeless tobacco: Never Used  . Alcohol Use: 0.0 oz/week    0 Standard drinks or equivalent per week     Comment: rare   OB History    No data available     Review of Systems  All other systems reviewed and are negative.     Allergies  Review of patient's allergies indicates no known allergies.  Home Medications   Prior to Admission medications   Medication Sig Start Date End Date Taking? Authorizing Provider  Cholecalciferol (VITAMIN D3) 5000 units CAPS Take 5,000 Units by mouth daily.    Historical Provider, MD  estradiol (VIVELLE-DOT) 0.1 MG/24HR patch Place 1 patch onto the skin once a week.    Historical Provider, MD  HM OMEGA-3-6-9 FATTY ACIDS CAPS Take 2 each by mouth daily. Omega 3-5-6-7-9    Historical Provider, MD  Nutritional Supplements (JUICE PLUS FIBRE PO) Take 2 capsules by mouth daily. 2 Berries, 2 Vegs, 2 Fruits    Historical Provider, MD  Probiotic Product (ALIGN) 4 MG CAPS Take 1 capsule by mouth daily.    Historical Provider, MD   BP 156/100 mmHg  Pulse 68  Temp(Src)  98.7 F (37.1 C) (Oral)  Resp 18  Ht  (1.575 m)  Wt 68.04 kg  BMI 27.43 kg/m2  SpO2 100% Physical Exam  Constitutional: She appears well-developed and well-nourished. No distress.  HENT:  Head: Normocephalic and atraumatic.  Right Ear: External ear normal.  Left Ear: External ear normal.  Eyes: Conjunctivae are normal. Right eye exhibits no discharge. Left eye exhibits no discharge. No scleral icterus.  Neck: Normal range of motion.  Cardiovascular: Normal rate and intact distal pulses.   Pedal pulse palpable  Pulmonary/Chest: Effort normal.  Musculoskeletal: Normal range of motion.       Right ankle: She exhibits swelling. She exhibits normal range of motion, no ecchymosis, no deformity, no laceration and normal pulse. Tenderness.       Feet:  TTP of  lateral ankle inferior to malleolus. No TTP of midfoot or lower leg. FROM at ankle intact. Mild soft tissue swelling.   Neurological: She is alert. Coordination normal.  Sensation to light touch intact  Skin: Skin is warm and dry.  Psychiatric: She has a normal mood and affect. Her behavior is normal.  Nursing note and vitals reviewed.   ED Course  Procedures (including critical care time) Labs Review Labs Reviewed - No data to display  Imaging Review Dg Ankle Complete Right  11/30/2015  CLINICAL DATA:  Status post fall.  Right ankle twisting injury. EXAM: RIGHT ANKLE - COMPLETE 3+ VIEW; RIGHT FOOT COMPLETE - 3+ VIEW COMPARISON:  None. FINDINGS: RIGHT ANKLE: There is no evidence of fracture, dislocation, or joint effusion. The ankle mortise is intact. There is no evidence of arthropathy or other focal bone abnormality. Soft tissues are unremarkable. RIGHT FOOT: There is no evidence of fracture, dislocation, or joint effusion. There is incidental note made of an os peroneus and os naviculare. There is incidental note made of a bipartite medial hallux sesamoid. There is no evidence of arthropathy or other focal bone abnormality. Soft tissues are unremarkable. IMPRESSION: 1. No acute osseous injury of the right ankle. 2.  No acute osseous injury of the right foot. Electronically Signed   By: Elige Ko   On: 11/30/2015 13:24   Dg Foot Complete Right  11/30/2015  CLINICAL DATA:  Status post fall.  Right ankle twisting injury. EXAM: RIGHT ANKLE - COMPLETE 3+ VIEW; RIGHT FOOT COMPLETE - 3+ VIEW COMPARISON:  None. FINDINGS: RIGHT ANKLE: There is no evidence of fracture, dislocation, or joint effusion. The ankle mortise is intact. There is no evidence of arthropathy or other focal bone abnormality. Soft tissues are unremarkable. RIGHT FOOT: There is no evidence of fracture, dislocation, or joint effusion. There is incidental note made of an os peroneus and os naviculare. There is incidental note made of  a bipartite medial hallux sesamoid. There is no evidence of arthropathy or other focal bone abnormality. Soft tissues are unremarkable. IMPRESSION: 1. No acute osseous injury of the right ankle. 2.  No acute osseous injury of the right foot. Electronically Signed   By: Elige Ko   On: 11/30/2015 13:24   I have personally reviewed and evaluated these images and lab results as part of my medical decision-making.   EKG Interpretation None      MDM   Final diagnoses:  Right ankle pain   Patient presenting with ankle pain after fall today. Right foot is neurovascularly intact with FROM. Mild soft tissue swelling with TTP at ankle. Patient X-Ray negative for obvious fracture or dislocation. Pain managed in ED  with ibuprofen. Brace and crutches given and conservative therapy recommended. Discussed RICE therapy and use of OTC pain relievers. Pt advised to follow up with orthopedics if symptoms persist. Pt also noted to be slightly hypertensive today. Pt has hx of HTN. Pt is to follow up with PCP in the next few days for BP recheck. Return precautions discussed at bedside and given in discharge paperwork. Pt is stable for discharge.     Alveta Heimlich, PA-C 11/30/15 1423  Glynn Octave, MD 11/30/15 [phone]

## 2016-07-26 ENCOUNTER — Encounter: Payer: Managed Care, Other (non HMO) | Admitting: Physician Assistant

## 2016-09-21 ENCOUNTER — Encounter: Payer: Managed Care, Other (non HMO) | Admitting: Physician Assistant

## 2016-10-25 NOTE — Progress Notes (Deleted)
Patient presents to clinic today for annual exam.  Patient is fasting for labs.  Acute Concerns: ***  Chronic Issues: ***  Health Maintenance: Immunizations -- up-to-date. Colonoscopy -- Due. ***. Mammogram -- Last 09/2015. ***. ***. PAP -- s/p hysterectomy  Past Medical History:  Diagnosis Date  . Cervical disc disease   . Elevated blood pressure (not hypertension)   . Impingement syndrome of right shoulder     Past Surgical History:  Procedure Laterality Date  . NECK SURGERY  07/23/2014   Cervical Disk  . SHOULDER SURGERY  2013   Right, Bursa  . TONSILLECTOMY  age 51  . VAGINAL HYSTERECTOMY  03-09-2000   W/ LEFT SALPINGOOPHECTOMY    Current Outpatient Prescriptions on File Prior to Visit  Medication Sig Dispense Refill  . Cholecalciferol (VITAMIN D3) 5000 units CAPS Take 5,000 Units by mouth daily.    Marland Kitchen. estradiol (VIVELLE-DOT) 0.1 MG/24HR patch Place 1 patch onto the skin once a week.    Marland Kitchen. HM OMEGA-3-6-9 FATTY ACIDS CAPS Take 2 each by mouth daily. Omega 3-5-6-7-9    . Nutritional Supplements (JUICE PLUS FIBRE PO) Take 2 capsules by mouth daily. 2 Berries, 2 Vegs, 2 Fruits    . Probiotic Product (ALIGN) 4 MG CAPS Take 1 capsule by mouth daily.     No current facility-administered medications on file prior to visit.     No Known Allergies  Family History  Problem Relation Age of Onset  . Heart disease Father        Living  . Fibromyalgia Mother   . Allergies Brother   . Healthy Son        x2  . Polycystic ovary syndrome Daughter        x1    Social History   Social History  . Marital status: Divorced    Spouse name: N/A  . Number of children: N/A  . Years of education: N/A   Occupational History  .      Librarian, academicexecutive director of law firm   Social History Main Topics  . Smoking status: Never Smoker  . Smokeless tobacco: Never Used  . Alcohol use 0.0 oz/week     Comment: rare  . Drug use: No  . Sexual activity: Yes    Birth control/  protection: Surgical   Other Topics Concern  . Not on file   Social History Narrative  . No narrative on file   Review of Systems  Constitutional: Negative for fever and weight loss.  HENT: Negative for ear discharge, ear pain, hearing loss and tinnitus.   Eyes: Negative for blurred vision, double vision, photophobia and pain.  Respiratory: Negative for cough and shortness of breath.   Cardiovascular: Negative for chest pain and palpitations.  Gastrointestinal: Negative for abdominal pain, blood in stool, constipation, diarrhea, heartburn, melena, nausea and vomiting.  Genitourinary: Negative for dysuria, flank pain, frequency, hematuria and urgency.  Musculoskeletal: Negative for falls.  Neurological: Negative for dizziness, loss of consciousness and headaches.  Endo/Heme/Allergies: Negative for environmental allergies.  Psychiatric/Behavioral: Negative for depression, hallucinations, substance abuse and suicidal ideas. The patient is not nervous/anxious and does not have insomnia.     There were no vitals taken for this visit.  Physical Exam  Constitutional: She is oriented to person, place, and time and well-developed, well-nourished, and in no distress.  HENT:  Head: Normocephalic and atraumatic.  Right Ear: Tympanic membrane, external ear and ear canal normal.  Left Ear: Tympanic membrane, external ear and  ear canal normal.  Nose: Nose normal. No mucosal edema.  Mouth/Throat: Uvula is midline, oropharynx is clear and moist and mucous membranes are normal. No oropharyngeal exudate or posterior oropharyngeal erythema.  Eyes: Conjunctivae are normal. Pupils are equal, round, and reactive to light.  Neck: Neck supple. No thyromegaly present.  Cardiovascular: Normal rate, regular rhythm, normal heart sounds and intact distal pulses.   Pulmonary/Chest: Effort normal and breath sounds normal. No respiratory distress. She has no wheezes. She has no rales.  Abdominal: Soft. Bowel  sounds are normal. She exhibits no distension and no mass. There is no tenderness. There is no rebound and no guarding.  Lymphadenopathy:    She has no cervical adenopathy.  Neurological: She is alert and oriented to person, place, and time. No cranial nerve deficit.  Skin: Skin is warm and dry. No rash noted.  Psychiatric: Affect normal.  Vitals reviewed.  Assessment/Plan: No problem-specific Assessment & Plan notes found for this encounter.    Piedad Climes, PA-C

## 2016-10-26 ENCOUNTER — Encounter: Payer: Managed Care, Other (non HMO) | Admitting: Physician Assistant

## 2016-10-26 DIAGNOSIS — Z0289 Encounter for other administrative examinations: Secondary | ICD-10-CM

## 2017-06-28 ENCOUNTER — Encounter (HOSPITAL_BASED_OUTPATIENT_CLINIC_OR_DEPARTMENT_OTHER): Payer: Self-pay | Admitting: *Deleted

## 2017-06-28 ENCOUNTER — Emergency Department (HOSPITAL_BASED_OUTPATIENT_CLINIC_OR_DEPARTMENT_OTHER): Payer: Managed Care, Other (non HMO)

## 2017-06-28 ENCOUNTER — Other Ambulatory Visit: Payer: Self-pay

## 2017-06-28 ENCOUNTER — Emergency Department (HOSPITAL_BASED_OUTPATIENT_CLINIC_OR_DEPARTMENT_OTHER)
Admission: EM | Admit: 2017-06-28 | Discharge: 2017-06-28 | Disposition: A | Discharge status: Home / Self Care | Payer: Managed Care, Other (non HMO) | Attending: Emergency Medicine | Admitting: Emergency Medicine

## 2017-06-28 DIAGNOSIS — Y990 Civilian activity done for income or pay: Secondary | ICD-10-CM | POA: Insufficient documentation

## 2017-06-28 DIAGNOSIS — R609 Edema, unspecified: Secondary | ICD-10-CM

## 2017-06-28 DIAGNOSIS — X500XXA Overexertion from strenuous movement or load, initial encounter: Secondary | ICD-10-CM | POA: Diagnosis not present

## 2017-06-28 DIAGNOSIS — R030 Elevated blood-pressure reading, without diagnosis of hypertension: Secondary | ICD-10-CM | POA: Diagnosis not present

## 2017-06-28 DIAGNOSIS — S6392XA Sprain of unspecified part of left wrist and hand, initial encounter: Secondary | ICD-10-CM | POA: Diagnosis not present

## 2017-06-28 DIAGNOSIS — Z79899 Other long term (current) drug therapy: Secondary | ICD-10-CM | POA: Insufficient documentation

## 2017-06-28 DIAGNOSIS — Y9389 Activity, other specified: Secondary | ICD-10-CM | POA: Diagnosis not present

## 2017-06-28 DIAGNOSIS — Y929 Unspecified place or not applicable: Secondary | ICD-10-CM | POA: Insufficient documentation

## 2017-06-28 DIAGNOSIS — R2232 Localized swelling, mass and lump, left upper limb: Secondary | ICD-10-CM | POA: Diagnosis present

## 2017-06-28 LAB — BASIC METABOLIC PANEL
ANION GAP: 9 (ref 5–15)
BUN: 12 mg/dL (ref 6–20)
CALCIUM: 8.9 mg/dL (ref 8.9–10.3)
CO2: 23 mmol/L (ref 22–32)
Chloride: 105 mmol/L (ref 101–111)
Creatinine, Ser: 0.9 mg/dL (ref 0.44–1.00)
Glucose, Bld: 103 mg/dL — ABNORMAL HIGH (ref 65–99)
Potassium: 3.6 mmol/L (ref 3.5–5.1)
SODIUM: 137 mmol/L (ref 135–145)

## 2017-06-28 MED ORDER — HYDROCHLOROTHIAZIDE 25 MG PO TABS: 25.0000 mg | ORAL_TABLET | Freq: Every day | ORAL | 0 refills | Status: DC

## 2017-06-28 NOTE — Discharge Instructions (Signed)
Hold an ice pack on left hand 4 times daily for 30 minutes of time.  Take Tylenol as directed for pain.  If your hand is still bothering you by next week see Malva Coganody Martin in the office.  You should get your blood pressure rechecked in a week at Dr. Ermalene SearingMartin's office.  Start taking the medication prescribed tomorrow which will help with fluid retention and with blood pressure.  Today's blood pressure

## 2017-06-28 NOTE — ED Provider Notes (Signed)
[facility] EMERGENCY DEPARTMENT Provider Note   CSN: [id number] Arrival date & time: 06/28/17  1925     History   Chief Complaint Chief Complaint  Patient presents with  . Joint Swelling    HPI Karen Gregory is a 52 y.o. female.  Patient reports left hand pain and swelling at MCP joint of middle finger after pushing boxes last night at work.  Hand pain is worse with extending her middle finger improved with holding hand in neutral position she reports that she has been retaining fluid for the past few months she has gained 20 pounds between October 2018 and May 17, 2017.  She has been told that her blood pressure was high after rotator cuff surgery.  She did not follow-up on elevated blood pressure.  No other associated symptoms no shortness of breath or chest pain.  No treatment prior to coming here  HPI  Past Medical History:  Diagnosis Date  . Cervical disc disease   . Elevated blood pressure (not hypertension)   . Impingement syndrome of right shoulder     Patient Active Problem List   Diagnosis Date Noted  . Body mass index 29.0-29.9, adult 09/12/2015  . Screening for breast cancer 09/12/2015  . Post menopausal syndrome 10/11/2014  . Abnormal thyroid function test 10/03/2014  . Breast cancer screening 09/10/2014  . Chronic constipation 09/10/2014  . Weight gain 09/10/2014  . Visit for preventive health examination 09/10/2014  . Cervical disc disorder with radiculopathy of cervical region 05/27/2014    Past Surgical History:  Procedure Laterality Date  . NECK SURGERY  07/23/2014   Cervical Disk  . SHOULDER SURGERY  2013   Right, Bursa  . TONSILLECTOMY  age 24  . VAGINAL HYSTERECTOMY  03-09-2000   W/ LEFT SALPINGOOPHECTOMY    OB History    No data available       Home Medications    Prior to Admission medications   Medication Sig Start Date End Date Taking? Authorizing Provider  estradiol (VIVELLE-DOT) 0.1 MG/24HR patch Place 1 patch  onto the skin once a week.   Yes [provider]  Cholecalciferol (VITAMIN D3) 5000 units CAPS Take 5,000 Units by mouth daily.    [provider]  HM OMEGA-3-6-9 FATTY ACIDS CAPS Take 2 each by mouth daily. Omega 3-5-6-7-9    [provider]  Nutritional Supplements (JUICE PLUS FIBRE PO) Take 2 capsules by mouth daily. 2 Berries, 2 Vegs, 2 Fruits    [provider]  Probiotic Product (ALIGN) 4 MG CAPS Take 1 capsule by mouth daily.    [provider]    Family History Family History  Problem Relation Age of Onset  . Heart disease Father        Living  . Fibromyalgia Mother   . Allergies Brother   . Healthy Son        x2  . Polycystic ovary syndrome Daughter        x1    Social History Social History   Tobacco Use  . Smoking status: Never Smoker  . Smokeless tobacco: Never Used  Substance Use Topics  . Alcohol use: Yes    Alcohol/week: 0.0 oz    Comment: rare  . Drug use: No     Allergies   Patient has no known allergies.   Review of Systems Review of Systems  Constitutional: Positive for unexpected weight change.  Cardiovascular: Positive for leg swelling.  Musculoskeletal: Positive for arthralgias.  Left hand pain  All other systems reviewed and are negative.    Physical Exam Updated Vital Signs BP (!) 158/112 (BP Location: Left Arm)   Pulse (!) 58   Temp 98 F (36.7 C) (Oral)   Resp 18   Ht 5\' 2"  (1.575 m)   Wt 74.8 kg (165 lb)   SpO2 99%   BMI 30.18 kg/m   Physical Exam  Constitutional: She appears well-developed and well-nourished.  HENT:  Head: Normocephalic and atraumatic.  Eyes: Conjunctivae are normal. Pupils are equal, round, and reactive to light.  Neck: Neck supple. No tracheal deviation present. No thyromegaly present.  Cardiovascular: Normal rate, regular rhythm and intact distal pulses.  No murmur heard. Pulmonary/Chest: Effort normal and breath sounds normal.  Abdominal: Soft. Bowel  sounds are normal. She exhibits no distension. There is no tenderness.  Musculoskeletal: Normal range of motion. She exhibits edema and tenderness.  Trace edema of bilateral lower extremities.  Finger is mildly swollen in both hands.  She is tender and ecchymotic at MCP joint of left hand.  Neurological: She is alert. Coordination normal.  Skin: Skin is warm and dry. Capillary refill takes less than 2 seconds. No rash noted.  Psychiatric: She has a normal mood and affect.  Nursing note and vitals reviewed.    ED Treatments / Results  Labs (all labs ordered are listed, but only abnormal results are displayed) Labs Reviewed  BASIC METABOLIC PANEL    EKG  EKG Interpretation None       Radiology Dg Hand Complete Left  Result Date: 06/28/2017 CLINICAL DATA:  Left hand pain since last evening without known injury. Pain and tightness about the third MCP. EXAM: LEFT HAND - COMPLETE 3+ VIEW COMPARISON:  None. FINDINGS: There is no evidence of fracture or dislocation. T normal bone mineralization. No marginal nor extra articular erosions. No periostitis. Soft tissues are unremarkable. IMPRESSION: Unremarkable left hand radiographs. Electronically Signed   By: Tollie Eth M.D.   On: 06/28/2017 22:16    Procedures Procedures (including critical care time)  Medications Ordered in ED Medications - No data to display X-ray viewed by me Results for orders placed or performed during the hospital encounter of 06/28/17  Basic metabolic panel  Result Value Ref Range   Sodium 137 135 - 145 mmol/L   Potassium 3.6 3.5 - 5.1 mmol/L   Chloride 105 101 - 111 mmol/L   CO2 23 22 - 32 mmol/L   Glucose, Bld 103 (H) 65 - 99 mg/dL   BUN 12 6 - 20 mg/dL   Creatinine, Ser 9.14 0.44 - 1.00 mg/dL   Calcium 8.9 8.9 - 78.2 mg/dL   GFR calc non Af Amer >60 >60 mL/min   GFR calc Af Amer >60 >60 mL/min   Anion gap 9 5 - 15   Dg Hand Complete Left  Result Date: 06/28/2017 CLINICAL DATA:  Left hand pain  since last evening without known injury. Pain and tightness about the third MCP. EXAM: LEFT HAND - COMPLETE 3+ VIEW COMPARISON:  None. FINDINGS: There is no evidence of fracture or dislocation. T normal bone mineralization. No marginal nor extra articular erosions. No periostitis. Soft tissues are unremarkable. IMPRESSION: Unremarkable left hand radiographs. Electronically Signed   By: Tollie Eth M.D.   On: 06/28/2017 22:16   Initial Impression / Assessment and Plan / ED Course  I have reviewed the triage vital signs and the nursing notes.  Pertinent labs & imaging results that were available during  my care of the patient were reviewed by me and considered in my medical decision making (see chart for details).   I do not feel the patient needs splint for her sprain of left hand.  Will write prescription for HCTZ.  Tylenol for discomfort.  Blood pressure recheck and recheck by Malva Coganody Martin 1 week.  Final Clinical Impressions(s) / ED Diagnoses  Diagnoses #1 sprain of left hand #2 elevated blood pressure #3 peripheral edema Final diagnoses:  None    ED Discharge Orders    None       Doug SouJacubowitz, Jaice Digioia, MD 06/28/17 2326

## 2017-06-28 NOTE — ED Triage Notes (Addendum)
Last night at work she pushed some boxes. Her left hand was sore. This am and through the day she has noticed swelling and joint pain in both her hands but worse in her left. States she has had a recent hx of unexplained weight gain over the past few months.

## 2017-07-04 ENCOUNTER — Other Ambulatory Visit: Payer: Self-pay

## 2017-07-04 ENCOUNTER — Ambulatory Visit: Payer: Managed Care, Other (non HMO) | Admitting: Physician Assistant

## 2017-07-04 ENCOUNTER — Encounter: Payer: Self-pay | Admitting: Physician Assistant

## 2017-07-04 VITALS — BP 138/98 | HR 76 | Temp 98.4°F | Resp 14 | Ht 62.0 in | Wt 166.0 lb

## 2017-07-04 DIAGNOSIS — I10 Essential (primary) hypertension: Secondary | ICD-10-CM

## 2017-07-04 DIAGNOSIS — R5382 Chronic fatigue, unspecified: Secondary | ICD-10-CM

## 2017-07-04 LAB — BASIC METABOLIC PANEL
BUN: 14 mg/dL (ref 6–23)
CO2: 31 mEq/L (ref 19–32)
CREATININE: 0.93 mg/dL (ref 0.40–1.20)
Calcium: 9.7 mg/dL (ref 8.4–10.5)
Chloride: 96 mEq/L (ref 96–112)
GFR: 67.45 mL/min (ref >60.00)
Glucose, Bld: 112 mg/dL — ABNORMAL HIGH (ref 70–99)
POTASSIUM: 4.3 meq/L (ref 3.5–5.1)
Sodium: 136 mEq/L (ref 135–145)

## 2017-07-04 LAB — CBC WITH DIFFERENTIAL/PLATELET
BASOS PCT: 0.7 % (ref 0.0–3.0)
Basophils Absolute: 0 10*3/uL (ref 0.0–0.1)
EOS ABS: 0.1 10*3/uL (ref 0.0–0.7)
Eosinophils Relative: 1.8 % (ref 0.0–5.0)
HEMATOCRIT: 47.7 % — AB (ref 36.0–46.0)
Hemoglobin: 16.5 g/dL — ABNORMAL HIGH (ref 12.0–15.0)
LYMPHS ABS: 1.2 10*3/uL (ref 0.7–4.0)
LYMPHS PCT: 34.9 % (ref 12.0–46.0)
MCHC: 34.6 g/dL (ref 30.0–36.0)
MCV: 90.1 fl (ref 78.0–100.0)
Monocytes Absolute: 0.5 10*3/uL (ref 0.1–1.0)
Monocytes Relative: 14.7 % — ABNORMAL HIGH (ref 3.0–12.0)
NEUTROS ABS: 1.7 10*3/uL (ref 1.4–7.7)
Neutrophils Relative %: 47.9 % (ref 43.0–77.0)
PLATELETS: 313 10*3/uL (ref 150.0–400.0)
RBC: 5.29 Mil/uL — ABNORMAL HIGH (ref 3.87–5.11)
RDW: 11.9 % (ref 11.5–15.5)
WBC: 3.5 10*3/uL — ABNORMAL LOW (ref 4.0–10.5)

## 2017-07-04 LAB — SEDIMENTATION RATE: Sed Rate: 9 mm/hr (ref 0–30)

## 2017-07-04 LAB — TSH: TSH: 0.88 u[IU]/mL (ref 0.35–4.50)

## 2017-07-04 NOTE — Progress Notes (Signed)
Patient presents to clinic today for ER follow-up. Patient has not been seen at this practice in around 2 years. Patient presented to ER on 06/28/17 with complaints of left hand pain. ER workup included imaging that was negative for a fracture. Patient was diagnosed with a hand sprain and supportive measures and OTC medications were reviewed. BP noted to be significantly elevated at that time. Giving history of some elevated BP measurements previously, she was started on HCTZ 25 mg and instructed to follow-up here in office. Since discharge, patient endorses taking HCTZ 25 mg daily as directed. Patient denies chest pain, palpitations, lightheadedness, dizziness, vision changes or frequent headaches.Does note chronic fatigue and giving recent joint aches in hand is worried about her thyroid and autoimmune cause of symptoms. Would like labs today. Denies fevers, chills, joint redness or swelling. Denies rash. Has history of borderline thyroid levels.   Past Medical History:  Diagnosis Date  . Cervical disc disease   . Elevated blood pressure (not hypertension)   . Impingement syndrome of right shoulder     Current Outpatient Medications on File Prior to Visit  Medication Sig Dispense Refill  . Cholecalciferol (VITAMIN D3) 5000 units CAPS Take 5,000 Units by mouth daily.    Marland Kitchen estradiol (VIVELLE-DOT) 0.1 MG/24HR patch Place 1 patch onto the skin once a week.    Marland Kitchen HM OMEGA-3-6-9 FATTY ACIDS CAPS Take 2 each by mouth daily. Omega 3-5-6-7-9    . hydrochlorothiazide (HYDRODIURIL) 25 MG tablet Take 1 tablet (25 mg total) by mouth daily. 30 tablet 0  . Nutritional Supplements (JUICE PLUS FIBRE PO) Take 2 capsules by mouth daily. 2 Berries, 2 Vegs, 2 Fruits    . Probiotic Product (ALIGN) 4 MG CAPS Take 1 capsule by mouth daily.     No current facility-administered medications on file prior to visit.     No Known Allergies  Family History  Adopted: Yes  Problem Relation Age of Onset  . Heart disease  Father        Living  . Fibromyalgia Mother   . Hypertension Mother   . Allergies Brother   . Autoimmune disease Brother   . Healthy Son        x2  . Polycystic ovary syndrome Daughter        x1  . Autoimmune disease Brother   . Autoimmune disease Brother     Social History   Socioeconomic History  . Marital status: Divorced    Spouse name: None  . Number of children: None  . Years of education: None  . Highest education level: None  Social Needs  . Financial resource strain: None  . Food insecurity - worry: None  . Food insecurity - inability: None  . Transportation needs - medical: None  . 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 Smoking status: Never Smoker  . Smokeless tobacco: Never Used  Substance and Sexual Activity  . Alcohol use: Yes    Alcohol/week: 0.0 oz    Comment: rare  . Drug use: No  . Sexual activity: Yes    Birth control/protection: Surgical  Other Topics Concern  . None  Social History Narrative  . None   Review of Systems - See HPI.  All other ROS are negative.  BP (!) 138/98   Pulse 76   Temp 98.4 F (36.9 C) (Oral)   Resp 14   Ht '5\' 2"'  (  1.575 m)   Wt 166 lb (75.3 kg)   SpO2 99%   BMI 30.36 kg/m   Physical Exam  Constitutional: She is oriented to person, place, and time and well-developed, well-nourished, and in no distress.  HENT:  Head: Normocephalic and atraumatic.  Neck: Neck supple.  Cardiovascular: Normal rate, regular rhythm, normal heart sounds and intact distal pulses.  Pulmonary/Chest: Effort normal and breath sounds normal. No respiratory distress. She has no wheezes. She has no rales. She exhibits no tenderness.  Musculoskeletal:       Right wrist: Normal.       Left wrist: Normal.       Right hand: Normal.       Left hand: She exhibits tenderness (over 2nd and third MCP joints without bony abnormality noted.). She exhibits no bony  tenderness, normal two-point discrimination and no swelling. Normal sensation noted. Normal strength noted.  Neurological: She is alert and oriented to person, place, and time. She has normal strength.  Skin: Skin is warm and dry. No rash noted.  Psychiatric: Affect normal.  Vitals reviewed.  Recent Results (from the past 2160 hour(s))  Basic metabolic panel     Status: Abnormal   Collection Time: 06/28/17 10:34 PM  Result Value Ref Range   Sodium 137 135 - 145 mmol/L   Potassium 3.6 3.5 - 5.1 mmol/L   Chloride 105 101 - 111 mmol/L   CO2 23 22 - 32 mmol/L   Glucose, Bld 103 (H) 65 - 99 mg/dL   BUN 12 6 - 20 mg/dL   Creatinine, Ser 0.90 0.44 - 1.00 mg/dL   Calcium 8.9 8.9 - 10.3 mg/dL   GFR calc non Af Amer >60 >60 mL/min   GFR calc Af Amer >60 >60 mL/min    Comment: (NOTE) The eGFR has been calculated using the CKD EPI equation. This calculation has not been validated in all clinical situations. eGFR's persistently <60 mL/min signify possible Chronic Kidney Disease.    Anion gap 9 5 - 15    Comment: Performed at [facility], [address] [place], [place] [address]  Rheumatoid Factor     Status: None   Collection Time: 07/04/17 12:14 PM  Result Value Ref Range   Rhuematoid fact SerPl-aCnc <14 <14 IU/mL  TSH     Status: None   Collection Time: 07/04/17 12:17 PM  Result Value Ref Range   TSH 0.88 0.35 - 4.50 uIU/mL  Sedimentation rate     Status: None   Collection Time: 07/04/17 12:17 PM  Result Value Ref Range   Sed Rate 9 0 - 30 mm/hr  CBC w/Diff     Status: Abnormal   Collection Time: 07/04/17 12:17 PM  Result Value Ref Range   WBC 3.5 (L) 4.0 - 10.5 K/uL   RBC 5.29 (H) 3.87 - 5.11 Mil/uL   Hemoglobin 16.5 (H) 12.0 - 15.0 g/dL   HCT 47.7 (H) 36.0 - 46.0 %   MCV 90.1 78.0 - 100.0 fl   MCHC 34.6 30.0 - 36.0 g/dL   RDW 11.9 11.5 - 15.5 %   Platelets 313.0 150.0 - 400.0 K/uL   Neutrophils Relative % 47.9 43.0 - 77.0 %   Lymphocytes Relative 34.9 12.0 -  46.0 %   Monocytes Relative 14.7 (H) 3.0 - 12.0 %   Eosinophils Relative 1.8 0.0 - 5.0 %   Basophils Relative 0.7 0.0 - 3.0 %   Neutro Abs 1.7 1.4 - 7.7 K/uL   Lymphs  Abs 1.2 0.7 - 4.0 K/uL   Monocytes Absolute 0.5 0.1 - 1.0 K/uL   Eosinophils Absolute 0.1 0.0 - 0.7 K/uL   Basophils Absolute 0.0 0.0 - 0.1 K/uL  Basic metabolic panel     Status: Abnormal   Collection Time: 07/04/17 12:17 PM  Result Value Ref Range   Sodium 136 135 - 145 mEq/L   Potassium 4.3 3.5 - 5.1 mEq/L   Chloride 96 96 - 112 mEq/L   CO2 31 19 - 32 mEq/L   Glucose, Bld 112 (H) 70 - 99 mg/dL   BUN 14 6 - 23 mg/dL   Creatinine, Ser 0.93 0.40 - 1.20 mg/dL   Calcium 9.7 8.4 - 10.5 mg/dL   GFR 67.45 >60.00 mL/min    Assessment/Plan: Essential hypertension Will check BMP today. Continue HCTZ 25 mg daily for now to give more time to work on BP. Keep up with DASH diet. Check BP at home. Follow-up 2 weeks.  May need to add-on ACEI to this regimen. This will help get better control and will help to help keep potassium stable.   Chronic fatigue Along with joint stiffness. Will check thyroid studies, CBC, CMP, ESR and RF to further assess. Supportive measures and OTC medications reviewed. Start Turmeric 500 mg daily for joint aches. Will alter regimen according to findings.     Leeanne Rio, PA-C

## 2017-07-04 NOTE — Patient Instructions (Addendum)
Please start the lisinopril once daily in addition to the HCTZ. Keep up with diet and exercise.  Please go to the lab today for blood work.  I will call you with your results. We will alter treatment regimen(s) if indicated by your results.   Follow-up in 2 weeks for reassessment.    DASH Eating Plan DASH stands for "Dietary Approaches to Stop Hypertension." The DASH eating plan is a healthy eating plan that has been shown to reduce high blood pressure (hypertension). It may also reduce your risk for type 2 diabetes, heart disease, and stroke. The DASH eating plan may also help with weight loss. What are tips for following this plan? General guidelines  Avoid eating more than 2,300 mg (milligrams) of salt (sodium) a day. If you have hypertension, you may need to reduce your sodium intake to 1,500 mg a day.  Limit alcohol intake to no more than 1 drink a day for nonpregnant women and 2 drinks a day for men. One drink equals 12 oz of beer, 5 oz of wine, or 1 oz of hard liquor.  Work with your health care provider to maintain a healthy body weight or to lose weight. Ask what an ideal weight is for you.  Get at least 30 minutes of exercise that causes your heart to beat faster (aerobic exercise) most days of the week. Activities may include walking, swimming, or biking.  Work with your health care provider or diet and nutrition specialist (dietitian) to adjust your eating plan to your individual calorie needs. Reading food labels  Check food labels for the amount of sodium per serving. Choose foods with less than 5 percent of the Daily Value of sodium. Generally, foods with less than 300 mg of sodium per serving fit into this eating plan.  To find whole grains, look for the word "whole" as the first word in the ingredient list. Shopping  Buy products labeled as "low-sodium" or "no salt added."  Buy fresh foods. Avoid canned foods and premade or frozen meals. Cooking  Avoid adding  salt when cooking. Use salt-free seasonings or herbs instead of table salt or sea salt. Check with your health care provider or pharmacist before using salt substitutes.  Do not fry foods. Cook foods using healthy methods such as baking, boiling, grilling, and broiling instead.  Cook with heart-healthy oils, such as olive, canola, soybean, or sunflower oil. Meal planning   Eat a balanced diet that includes: ? 5 or more servings of fruits and vegetables each day. At each meal, try to fill half of your plate with fruits and vegetables. ? Up to 6-8 servings of whole grains each day. ? Less than 6 oz of lean meat, poultry, or fish each day. A 3-oz serving of meat is about the same size as a deck of cards. One egg equals 1 oz. ? 2 servings of low-fat dairy each day. ? A serving of nuts, seeds, or beans 5 times each week. ? Heart-healthy fats. Healthy fats called Omega-3 fatty acids are found in foods such as flaxseeds and coldwater fish, like sardines, salmon, and mackerel.  Limit how much you eat of the following: ? Canned or prepackaged foods. ? Food that is high in trans fat, such as fried foods. ? Food that is high in saturated fat, such as fatty meat. ? Sweets, desserts, sugary drinks, and other foods with added sugar. ? Full-fat dairy products.  Do not salt foods before eating.  Try to eat at least  2 vegetarian meals each week.  Eat more home-cooked food and less restaurant, buffet, and fast food.  When eating at a restaurant, ask that your food be prepared with less salt or no salt, if possible. What foods are recommended? The items listed may not be a complete list. Talk with your dietitian about what dietary choices are best for you. Grains Whole-grain or whole-wheat bread. Whole-grain or whole-wheat pasta. Brown rice. [place]. Bulgur. Whole-grain and low-sodium cereals. Pita bread. Low-fat, low-sodium crackers. Whole-wheat flour tortillas. Vegetables Fresh or frozen  vegetables (raw, steamed, roasted, or grilled). Low-sodium or reduced-sodium tomato and vegetable juice. Low-sodium or reduced-sodium tomato sauce and tomato paste. Low-sodium or reduced-sodium canned vegetables. Fruits All fresh, dried, or frozen fruit. Canned fruit in natural juice (without added sugar). Meat and other protein foods Skinless chicken or [place]. Ground chicken or [place]. Pork with fat trimmed off. Fish and seafood. Egg whites. Dried beans, peas, or lentils. Unsalted nuts, nut butters, and seeds. Unsalted canned beans. Lean cuts of beef with fat trimmed off. Low-sodium, lean deli meat. Dairy Low-fat (1%) or fat-free (skim) milk. Fat-free, low-fat, or reduced-fat cheeses. Nonfat, low-sodium ricotta or cottage cheese. Low-fat or nonfat yogurt. Low-fat, low-sodium cheese. Fats and oils Soft margarine without trans fats. Vegetable oil. Low-fat, reduced-fat, or light mayonnaise and salad dressings (reduced-sodium). Canola, safflower, olive, soybean, and sunflower oils. Avocado. Seasoning and other foods Herbs. Spices. Seasoning mixes without salt. Unsalted popcorn and pretzels. Fat-free sweets. What foods are not recommended? The items listed may not be a complete list. Talk with your dietitian about what dietary choices are best for you. Grains Baked goods made with fat, such as croissants, muffins, or some breads. Dry pasta or rice meal packs. Vegetables Creamed or fried vegetables. Vegetables in a cheese sauce. Regular canned vegetables (not low-sodium or reduced-sodium). Regular canned tomato sauce and paste (not low-sodium or reduced-sodium). Regular tomato and vegetable juice (not low-sodium or reduced-sodium). Angie Fava. Olives. Fruits Canned fruit in a light or heavy syrup. Fried fruit. Fruit in cream or butter sauce. Meat and other protein foods Fatty cuts of meat. Ribs. Fried meat. Berniece Salines. Sausage. Bologna and other processed lunch meats. Salami. Fatback. Hotdogs. Bratwurst.  Salted nuts and seeds. Canned beans with added salt. Canned or smoked fish. Whole eggs or egg yolks. Chicken or [place] with skin. Dairy Whole or 2% milk, cream, and half-and-half. Whole or full-fat cream cheese. Whole-fat or sweetened yogurt. Full-fat cheese. Nondairy creamers. Whipped toppings. Processed cheese and cheese spreads. Fats and oils Butter. Stick margarine. Lard. Shortening. Ghee. Bacon fat. Tropical oils, such as coconut, palm kernel, or palm oil. Seasoning and other foods Salted popcorn and pretzels. Onion salt, garlic salt, seasoned salt, table salt, and sea salt. Worcestershire sauce. Tartar sauce. Barbecue sauce. Teriyaki sauce. Soy sauce, including reduced-sodium. Steak sauce. Canned and packaged gravies. Fish sauce. Oyster sauce. Cocktail sauce. Horseradish that you find on the shelf. Ketchup. Mustard. Meat flavorings and tenderizers. Bouillon cubes. Hot sauce and Tabasco sauce. Premade or packaged marinades. Premade or packaged taco seasonings. Relishes. Regular salad dressings. Where to find more information:  National Heart, Lung, and [place]: https://wilson-eaton.com/  American Heart Association: www.heart.org Summary  The DASH eating plan is a healthy eating plan that has been shown to reduce high blood pressure (hypertension). It may also reduce your risk for type 2 diabetes, heart disease, and stroke.  With the DASH eating plan, you should limit salt (sodium) intake to 2,300 mg a day. If you have hypertension, you may  need to reduce your sodium intake to 1,500 mg a day.  When on the DASH eating plan, aim to eat more fresh fruits and vegetables, whole grains, lean proteins, low-fat dairy, and heart-healthy fats.  Work with your health care provider or diet and nutrition specialist (dietitian) to adjust your eating plan to your individual calorie needs. This information is not intended to replace advice given to you by your health care provider. Make sure you discuss any  questions you have with your health care provider. Document Released: 04/22/2011 Document Revised: 04/26/2016 Document Reviewed: 04/26/2016 Elsevier Interactive Patient Education  [employer].

## 2017-07-05 ENCOUNTER — Other Ambulatory Visit: Payer: Self-pay | Admitting: Emergency Medicine

## 2017-07-05 DIAGNOSIS — I10 Essential (primary) hypertension: Secondary | ICD-10-CM | POA: Insufficient documentation

## 2017-07-05 DIAGNOSIS — R5382 Chronic fatigue, unspecified: Secondary | ICD-10-CM | POA: Insufficient documentation

## 2017-07-05 LAB — RHEUMATOID FACTOR: Rhuematoid fact SerPl-aCnc: <14 IU/mL (ref <14)

## 2017-07-05 MED ORDER — HYDROCHLOROTHIAZIDE 25 MG PO TABS: 25.0000 mg | ORAL_TABLET | Freq: Every day | ORAL | 3 refills | Status: DC

## 2017-07-05 MED ORDER — LISINOPRIL 10 MG PO TABS: 10.0000 mg | ORAL_TABLET | Freq: Every day | ORAL | 3 refills | Status: DC

## 2017-07-05 NOTE — Assessment & Plan Note (Signed)
Along with joint stiffness. Will check thyroid studies, CBC, CMP, ESR and RF to further assess. Supportive measures and OTC medications reviewed. Start Turmeric 500 mg daily for joint aches. Will alter regimen according to findings.

## 2017-07-05 NOTE — Assessment & Plan Note (Signed)
Will check BMP today. Continue HCTZ 25 mg daily for now to give more time to work on BP. Keep up with DASH diet. Check BP at home. Follow-up 2 weeks.  May need to add-on ACEI to this regimen. This will help get better control and will help to help keep potassium stable.

## 2017-07-14 ENCOUNTER — Other Ambulatory Visit (INDEPENDENT_AMBULATORY_CARE_PROVIDER_SITE_OTHER): Payer: Managed Care, Other (non HMO)

## 2017-07-14 ENCOUNTER — Other Ambulatory Visit: Payer: Self-pay | Admitting: Physician Assistant

## 2017-07-14 DIAGNOSIS — D729 Disorder of white blood cells, unspecified: Secondary | ICD-10-CM | POA: Diagnosis not present

## 2017-07-14 LAB — CBC WITH DIFFERENTIAL/PLATELET
BASOS PCT: 0.4 % (ref 0.0–3.0)
Basophils Absolute: 0 10*3/uL (ref 0.0–0.1)
EOS PCT: 2.9 % (ref 0.0–5.0)
Eosinophils Absolute: 0.2 10*3/uL (ref 0.0–0.7)
HEMATOCRIT: 40.2 % (ref 36.0–46.0)
Hemoglobin: 14 g/dL (ref 12.0–15.0)
LYMPHS PCT: 32.3 % (ref 12.0–46.0)
Lymphs Abs: 1.9 10*3/uL (ref 0.7–4.0)
MCHC: 34.8 g/dL (ref 30.0–36.0)
MCV: 89.1 fl (ref 78.0–100.0)
MONOS PCT: 7.6 % (ref 3.0–12.0)
Monocytes Absolute: 0.4 10*3/uL (ref 0.1–1.0)
NEUTROS ABS: 3.3 10*3/uL (ref 1.4–7.7)
Neutrophils Relative %: 56.8 % (ref 43.0–77.0)
PLATELETS: 339 10*3/uL (ref 150.0–400.0)
RBC: 4.51 Mil/uL (ref 3.87–5.11)
RDW: 11.6 % (ref 11.5–15.5)
WBC: 5.7 10*3/uL (ref 4.0–10.5)

## 2017-07-15 LAB — IRON,TIBC AND FERRITIN PANEL
%SAT: 44 % (calc) (ref 11–50)
FERRITIN: 340 ng/mL — AB (ref 10–232)
IRON: 124 ug/dL (ref 45–160)
TIBC: 282 mcg/dL (calc) (ref 250–450)

## 2017-07-18 ENCOUNTER — Ambulatory Visit: Payer: Managed Care, Other (non HMO) | Admitting: Physician Assistant

## 2017-07-19 ENCOUNTER — Other Ambulatory Visit: Payer: Self-pay

## 2017-07-19 ENCOUNTER — Ambulatory Visit: Payer: Managed Care, Other (non HMO) | Admitting: Physician Assistant

## 2017-07-19 ENCOUNTER — Encounter: Payer: Self-pay | Admitting: Physician Assistant

## 2017-07-19 VITALS — BP 118/74 | HR 74 | Temp 98.1°F | Resp 17 | Ht 62.0 in | Wt 167.4 lb

## 2017-07-19 DIAGNOSIS — M130 Polyarthritis, unspecified: Secondary | ICD-10-CM

## 2017-07-19 DIAGNOSIS — I10 Essential (primary) hypertension: Secondary | ICD-10-CM | POA: Diagnosis not present

## 2017-07-19 NOTE — Patient Instructions (Signed)
Please stay well-hydrated and get plenty of rest.  Continue current medication regimen. I am checking renal function and electrolytes. We will make adjustments once I review this.   Keep a low salt diet and keep active.  Keep your phone on. You will be contacted for assessment by Rheumatology.   We will schedule follow-up based on lab results.

## 2017-07-19 NOTE — Progress Notes (Signed)
Patient presents to clinic today for follow-up of hypertension. At last visit, patient was started on Lisinopril 10 mg and HCTZ 25 mg daily. Is taking as directed. Has noted significant improvement in BP when checked at home. Notes overall she feels much better. Is keeping up with low-salt diet. Is staying very active. Does note difficulty staying hydrated while exercising since starting medication.  Past Medical History:  Diagnosis Date  . Cervical disc disease   . Elevated blood pressure (not hypertension)   . Impingement syndrome of right shoulder     Current Outpatient Medications on File Prior to Visit  Medication Sig Dispense Refill  . Cholecalciferol (VITAMIN D3) 5000 units CAPS Take 5,000 Units by mouth daily.    Marland Kitchen estradiol (VIVELLE-DOT) 0.1 MG/24HR patch Place 1 patch onto the skin once a week.    Marland Kitchen HM OMEGA-3-6-9 FATTY ACIDS CAPS Take 2 each by mouth daily. Omega 3-5-6-7-9    . hydrochlorothiazide (HYDRODIURIL) 25 MG tablet Take 1 tablet (25 mg total) by mouth daily. 30 tablet 3  . lisinopril (PRINIVIL,ZESTRIL) 10 MG tablet Take 1 tablet (10 mg total) by mouth daily. 30 tablet 3  . Nutritional Supplements (JUICE PLUS FIBRE PO) Take 2 capsules by mouth daily. 2 Berries, 2 Vegs, 2 Fruits    . Probiotic Product (ALIGN) 4 MG CAPS Take 1 capsule by mouth daily.     No current facility-administered medications on file prior to visit.     No Known Allergies  Family History  Adopted: Yes  Problem Relation Age of Onset  . Heart disease Father        Living  . Fibromyalgia Mother   . Hypertension Mother   . Allergies Brother   . Autoimmune disease Brother   . Healthy Son        x2  . Polycystic ovary syndrome Daughter        x1  . Autoimmune disease Brother   . Autoimmune disease Brother     Social History   Socioeconomic History  . Marital status: Divorced    Spouse name: None  . Number of children: None  . Years of education: None  . Highest education level:  None  Social Needs  . Financial resource strain: None  . Food insecurity - worry: None  . Food insecurity - inability: None  . Transportation needs - medical: None  . 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 Smoking status: Never Smoker  . Smokeless tobacco: Never Used  Substance and Sexual Activity  . Alcohol use: Yes    Alcohol/week: 0.0 oz    Comment: rare  . Drug use: No  . Sexual activity: Yes    Birth control/protection: Surgical  Other Topics Concern  . None  Social History Narrative  . None   Review of Systems - See HPI.  All other ROS are negative.  BP 118/74   Pulse 74   Temp 98.1 F (36.7 C) (Oral)   Resp 17   Ht '5\' 2"'  (1.575 m)   Wt 167 lb 6.4 oz (75.9 kg)   SpO2 99%   BMI 30.62 kg/m   Physical Exam  Constitutional: She is oriented to person, place, and time and well-developed, well-nourished, and in no distress.  HENT:  Head: Normocephalic and atraumatic.  Eyes: Conjunctivae are normal.  Cardiovascular: Normal rate, regular rhythm, normal heart sounds and intact distal pulses.  Pulmonary/Chest: Effort  normal and breath sounds normal. No respiratory distress. She has no wheezes. She has no rales. She exhibits no tenderness.  Neurological: She is alert and oriented to person, place, and time.  Skin: Skin is warm and dry. No rash noted.  Psychiatric: Affect normal.  Vitals reviewed.   Recent Results (from the past 2160 hour(s))  Basic metabolic panel     Status: Abnormal   Collection Time: 06/28/17 10:34 PM  Result Value Ref Range   Sodium 137 135 - 145 mmol/L   Potassium 3.6 3.5 - 5.1 mmol/L   Chloride 105 101 - 111 mmol/L   CO2 23 22 - 32 mmol/L   Glucose, Bld 103 (H) 65 - 99 mg/dL   BUN 12 6 - 20 mg/dL   Creatinine, Ser 0.90 0.44 - 1.00 mg/dL   Calcium 8.9 8.9 - 10.3 mg/dL   GFR calc non Af Amer >60 >60 mL/min   GFR calc Af Amer >60 >60 mL/min    Comment:  (NOTE) The eGFR has been calculated using the CKD EPI equation. This calculation has not been validated in all clinical situations. eGFR's persistently <60 mL/min signify possible Chronic Kidney Disease.    Anion gap 9 5 - 15    Comment: Performed at [facility], [address] [place], [place] [address]  Rheumatoid Factor     Status: None   Collection Time: 07/04/17 12:14 PM  Result Value Ref Range   Rhuematoid fact SerPl-aCnc <14 <14 IU/mL  TSH     Status: None   Collection Time: 07/04/17 12:17 PM  Result Value Ref Range   TSH 0.88 0.35 - 4.50 uIU/mL  Sedimentation rate     Status: None   Collection Time: 07/04/17 12:17 PM  Result Value Ref Range   Sed Rate 9 0 - 30 mm/hr  CBC w/Diff     Status: Abnormal   Collection Time: 07/04/17 12:17 PM  Result Value Ref Range   WBC 3.5 (L) 4.0 - 10.5 K/uL   RBC 5.29 (H) 3.87 - 5.11 Mil/uL   Hemoglobin 16.5 (H) 12.0 - 15.0 g/dL   HCT 47.7 (H) 36.0 - 46.0 %   MCV 90.1 78.0 - 100.0 fl   MCHC 34.6 30.0 - 36.0 g/dL   RDW 11.9 11.5 - 15.5 %   Platelets 313.0 150.0 - 400.0 K/uL   Neutrophils Relative % 47.9 43.0 - 77.0 %   Lymphocytes Relative 34.9 12.0 - 46.0 %   Monocytes Relative 14.7 (H) 3.0 - 12.0 %   Eosinophils Relative 1.8 0.0 - 5.0 %   Basophils Relative 0.7 0.0 - 3.0 %   Neutro Abs 1.7 1.4 - 7.7 K/uL   Lymphs Abs 1.2 0.7 - 4.0 K/uL   Monocytes Absolute 0.5 0.1 - 1.0 K/uL   Eosinophils Absolute 0.1 0.0 - 0.7 K/uL   Basophils Absolute 0.0 0.0 - 0.1 K/uL  Basic metabolic panel     Status: Abnormal   Collection Time: 07/04/17 12:17 PM  Result Value Ref Range   Sodium 136 135 - 145 mEq/L   Potassium 4.3 3.5 - 5.1 mEq/L   Chloride 96 96 - 112 mEq/L   CO2 31 19 - 32 mEq/L   Glucose, Bld 112 (H) 70 - 99 mg/dL   BUN 14 6 - 23 mg/dL   Creatinine, Ser 0.93 0.40 - 1.20 mg/dL   Calcium 9.7 8.4 - 10.5 mg/dL   GFR 67.45 >60.00 mL/min  Iron, TIBC and Ferritin Panel  Status: Abnormal   Collection Time: 07/14/17  9:00 AM    Result Value Ref Range   Iron 124 45 - 160 mcg/dL   TIBC 282 250 - 450 mcg/dL (calc)   %SAT 44 11 - 50 % (calc)   Ferritin 340 (H) 10 - 232 ng/mL  CBC with Differential/Platelet     Status: None   Collection Time: 07/14/17  9:00 AM  Result Value Ref Range   WBC 5.7 4.0 - 10.5 K/uL   RBC 4.51 3.87 - 5.11 Mil/uL   Hemoglobin 14.0 12.0 - 15.0 g/dL   HCT 40.2 36.0 - 46.0 %   MCV 89.1 78.0 - 100.0 fl   MCHC 34.8 30.0 - 36.0 g/dL   RDW 11.6 11.5 - 15.5 %   Platelets 339.0 150.0 - 400.0 K/uL   Neutrophils Relative % 56.8 43.0 - 77.0 %   Lymphocytes Relative 32.3 12.0 - 46.0 %   Monocytes Relative 7.6 3.0 - 12.0 %   Eosinophils Relative 2.9 0.0 - 5.0 %   Basophils Relative 0.4 0.0 - 3.0 %   Neutro Abs 3.3 1.4 - 7.7 K/uL   Lymphs Abs 1.9 0.7 - 4.0 K/uL   Monocytes Absolute 0.4 0.1 - 1.0 K/uL   Eosinophils Absolute 0.2 0.0 - 0.7 K/uL   Basophils Absolute 0.0 0.0 - 0.1 K/uL    Assessment/Plan: 1. Polyarthritis Previous workup. Agrees to see specialist. Referral placed. - Ambulatory referral to Rheumatology  2. Essential hypertension BP stable today. Will check renal panel today. May consider decreasing HCTZ and increasing Lisinopril due to effects noted while exercising.  - Basic metabolic panel    Leeanne Rio, PA-C

## 2017-07-20 ENCOUNTER — Other Ambulatory Visit: Payer: Self-pay | Admitting: Emergency Medicine

## 2017-07-20 DIAGNOSIS — I10 Essential (primary) hypertension: Secondary | ICD-10-CM

## 2017-07-20 LAB — BASIC METABOLIC PANEL
BUN: 15 mg/dL (ref 7–25)
CALCIUM: 10.1 mg/dL (ref 8.6–10.4)
CHLORIDE: 101 mmol/L (ref 98–110)
CO2: 28 mmol/L (ref 20–32)
CREATININE: 0.98 mg/dL (ref 0.50–1.05)
Glucose, Bld: 98 mg/dL (ref 65–99)
POTASSIUM: 4 mmol/L (ref 3.5–5.3)
Sodium: 137 mmol/L (ref 135–146)

## 2017-07-20 LAB — EXTRA LAV TOP TUBE

## 2017-07-20 MED ORDER — LISINOPRIL 20 MG PO TABS: 20.0000 mg | ORAL_TABLET | Freq: Every day | ORAL | 1 refills | Status: DC

## 2017-07-20 MED ORDER — HYDROCHLOROTHIAZIDE 12.5 MG PO TABS: 12.5000 mg | ORAL_TABLET | Freq: Every day | ORAL | 1 refills | Status: DC

## 2017-08-14 ENCOUNTER — Other Ambulatory Visit: Payer: Self-pay | Admitting: Physician Assistant

## 2017-08-14 DIAGNOSIS — I10 Essential (primary) hypertension: Secondary | ICD-10-CM

## 2017-08-22 ENCOUNTER — Ambulatory Visit: Payer: Managed Care, Other (non HMO) | Admitting: Physician Assistant

## 2017-08-22 ENCOUNTER — Other Ambulatory Visit: Payer: Self-pay

## 2017-08-22 ENCOUNTER — Encounter: Payer: Self-pay | Admitting: Physician Assistant

## 2017-08-22 DIAGNOSIS — I10 Essential (primary) hypertension: Secondary | ICD-10-CM | POA: Diagnosis not present

## 2017-08-22 NOTE — Patient Instructions (Signed)
BP looks better today. I am glad you are feeling much better. Keep up with current regimen. Follow-up in 3 months for your complete physical.

## 2017-08-22 NOTE — Assessment & Plan Note (Signed)
BP stable. Asymptomatic. Continue current regimen. Follow-up 3 months for CPE.

## 2017-08-22 NOTE — Progress Notes (Signed)
Patient presents to clinic today for follow-up of hypertension. At last visit her regimen was changed to lisinopril 20 mg daily and HCTZ 12.5 mg daily. Patient is taking medications as directed. Notes resolution of headaches with change in regimen. Patient denies chest pain, palpitations, vision changes or frequent headaches. . .   Past Medical History:  Diagnosis Date  . Cervical disc disease   . Elevated blood pressure (not hypertension)   . Impingement syndrome of right shoulder     Current Outpatient Medications on File Prior to Visit  Medication Sig Dispense Refill  . Cholecalciferol (VITAMIN D3) 5000 units CAPS Take 5,000 Units by mouth daily.    Marland Kitchen estradiol (VIVELLE-DOT) 0.1 MG/24HR patch Place 1 patch onto the skin once a week.    Marland Kitchen HM OMEGA-3-6-9 FATTY ACIDS CAPS Take 2 each by mouth daily. Omega 3-5-6-7-9    . hydrochlorothiazide (HYDRODIURIL) 12.5 MG tablet TAKE 1 TABLET BY MOUTH EVERY DAY 90 tablet 1  . lisinopril (PRINIVIL,ZESTRIL) 20 MG tablet TAKE 1 TABLET BY MOUTH EVERY DAY 90 tablet 1  . Nutritional Supplements (JUICE PLUS FIBRE PO) Take 2 capsules by mouth daily. 2 Berries, 2 Vegs, 2 Fruits    . Probiotic Product (ALIGN) 4 MG CAPS Take 1 capsule by mouth daily.    . Turmeric 500 MG CAPS Take 1 capsule by mouth daily.     No current facility-administered medications on file prior to visit.     No Known Allergies  Family History  Adopted: Yes  Problem Relation Age of Onset  . Heart disease Father        Living  . Fibromyalgia Mother   . Hypertension Mother   . Allergies Brother   . Autoimmune disease Brother   . Healthy Son        x2  . Polycystic ovary syndrome Daughter        x1  . Autoimmune disease Brother   . Autoimmune disease Brother     Social History   Socioeconomic History  . Marital status: Divorced    Spouse name: Not on file  . Number of children: Not on file  . Years of education: Not on file  . Highest education level: Not on file    Occupational History    Comment: Development worker, [employer]  Social Needs  . Financial resource strain: Not on file  . Food insecurity:    Worry: Not on file    Inability: Not on file  . Transportation needs:    Medical: Not on file    Non-medical: Not on file  Tobacco Use  . Smoking status: Never Smoker  . Smokeless tobacco: Never Used  Substance and Sexual Activity  . Alcohol use: Yes    Alcohol/week: 0.0 oz    Comment: rare  . Drug use: No  . Sexual activity: Yes    Birth control/protection: Surgical  Lifestyle  . Physical activity:    Days per week: Not on file    Minutes per session: Not on file  . Stress: Not on file  Relationships  . Social connections:    Talks on phone: Not on file    Gets together: Not on file    Attends religious service: Not on file    Active member of club or organization: Not on file    Attends meetings of clubs or organizations: Not on file    Relationship status: Not on file  Other Topics Concern  . Not on file  Social History Narrative  . Not on file   Review of Systems - See HPI.  All other ROS are negative.  BP 108/64   Pulse 68   Temp 98.1 F (36.7 C) (Oral)   Resp 14   Ht _0  (1.575 m)   Wt 162 lb (73.5 kg)   SpO2 98%   BMI 29.63 kg/m   Physical Exam  Constitutional: She is oriented to person, place, and time. She appears well-developed and well-nourished.  HENT:  Head: Normocephalic and atraumatic.  Eyes: Conjunctivae are normal.  Cardiovascular: Normal rate, regular rhythm, normal heart sounds and intact distal pulses.  Pulmonary/Chest: Effort normal.  Neurological: She is alert and oriented to person, place, and time.  Skin: Skin is warm and dry.  Psychiatric: She has a normal mood and affect.  Vitals reviewed.   Recent Results (from the past 2160 hour(s))  Basic metabolic panel     Status: Abnormal   Collection Time: 06/28/17 10:34 PM  Result Value Ref Range   Sodium 137 135 - 145 mmol/L   Potassium  3.6 3.5 - 5.1 mmol/L   Chloride 105 101 - 111 mmol/L   CO2 23 22 - 32 mmol/L   Glucose, Bld 103 (H) 65 - 99 mg/dL   BUN 12 6 - 20 mg/dL   Creatinine, Ser 0.90 0.44 - 1.00 mg/dL   Calcium 8.9 8.9 - 10.3 mg/dL   GFR calc non Af Amer >60 >60 mL/min   GFR calc Af Amer >60 >60 mL/min    Comment: (NOTE) The eGFR has been calculated using the CKD EPI equation. This calculation has not been validated in all clinical situations. eGFR's persistently <60 mL/min signify possible Chronic Kidney Disease.    Anion gap 9 5 - 15    Comment: Performed at [facility], [address] [place], [place] [address]  Rheumatoid Factor     Status: None   Collection Time: 07/04/17 12:14 PM  Result Value Ref Range   Rhuematoid fact SerPl-aCnc <14 <14 IU/mL  TSH     Status: None   Collection Time: 07/04/17 12:17 PM  Result Value Ref Range   TSH 0.88 0.35 - 4.50 uIU/mL  Sedimentation rate     Status: None   Collection Time: 07/04/17 12:17 PM  Result Value Ref Range   Sed Rate 9 0 - 30 mm/hr  CBC w/Diff     Status: Abnormal   Collection Time: 07/04/17 12:17 PM  Result Value Ref Range   WBC 3.5 (L) 4.0 - 10.5 K/uL   RBC 5.29 (H) 3.87 - 5.11 Mil/uL   Hemoglobin 16.5 (H) 12.0 - 15.0 g/dL   HCT 47.7 (H) 36.0 - 46.0 %   MCV 90.1 78.0 - 100.0 fl   MCHC 34.6 30.0 - 36.0 g/dL   RDW 11.9 11.5 - 15.5 %   Platelets 313.0 150.0 - 400.0 K/uL   Neutrophils Relative % 47.9 43.0 - 77.0 %   Lymphocytes Relative 34.9 12.0 - 46.0 %   Monocytes Relative 14.7 (H) 3.0 - 12.0 %   Eosinophils Relative 1.8 0.0 - 5.0 %   Basophils Relative 0.7 0.0 - 3.0 %   Neutro Abs 1.7 1.4 - 7.7 K/uL   Lymphs Abs 1.2 0.7 - 4.0 K/uL   Monocytes Absolute 0.5 0.1 - 1.0 K/uL   Eosinophils Absolute 0.1 0.0 - 0.7 K/uL   Basophils Absolute 0.0 0.0 - 0.1 K/uL  Basic metabolic panel     Status: Abnormal  Collection Time: 07/04/17 12:17 PM  Result Value Ref Range   Sodium 136 135 - 145 mEq/L   Potassium 4.3 3.5 - 5.1 mEq/L    Chloride 96 96 - 112 mEq/L   CO2 31 19 - 32 mEq/L   Glucose, Bld 112 (H) 70 - 99 mg/dL   BUN 14 6 - 23 mg/dL   Creatinine, Ser 0.93 0.40 - 1.20 mg/dL   Calcium 9.7 8.4 - 10.5 mg/dL   GFR 67.45 >60.00 mL/min  Iron, TIBC and Ferritin Panel     Status: Abnormal   Collection Time: 07/14/17  9:00 AM  Result Value Ref Range   Iron 124 45 - 160 mcg/dL   TIBC 282 250 - 450 mcg/dL (calc)   %SAT 44 11 - 50 % (calc)   Ferritin 340 (H) 10 - 232 ng/mL  CBC with Differential/Platelet     Status: None   Collection Time: 07/14/17  9:00 AM  Result Value Ref Range   WBC 5.7 4.0 - 10.5 K/uL   RBC 4.51 3.87 - 5.11 Mil/uL   Hemoglobin 14.0 12.0 - 15.0 g/dL   HCT 40.2 36.0 - 46.0 %   MCV 89.1 78.0 - 100.0 fl   MCHC 34.8 30.0 - 36.0 g/dL   RDW 11.6 11.5 - 15.5 %   Platelets 339.0 150.0 - 400.0 K/uL   Neutrophils Relative % 56.8 43.0 - 77.0 %   Lymphocytes Relative 32.3 12.0 - 46.0 %   Monocytes Relative 7.6 3.0 - 12.0 %   Eosinophils Relative 2.9 0.0 - 5.0 %   Basophils Relative 0.4 0.0 - 3.0 %   Neutro Abs 3.3 1.4 - 7.7 K/uL   Lymphs Abs 1.9 0.7 - 4.0 K/uL   Monocytes Absolute 0.4 0.1 - 1.0 K/uL   Eosinophils Absolute 0.2 0.0 - 0.7 K/uL   Basophils Absolute 0.0 0.0 - 0.1 K/uL  Basic metabolic panel     Status: None   Collection Time: 07/19/17  4:05 PM  Result Value Ref Range   Glucose, Bld 98 65 - 99 mg/dL    Comment: .            Fasting reference interval .    BUN 15 7 - 25 mg/dL   Creat 0.98 0.50 - 1.05 mg/dL    Comment: For patients >51 years of age, the reference limit for Creatinine is approximately 13% higher for people identified as African-American. .    BUN/Creatinine Ratio NOT APPLICABLE 6 - 22 (calc)   Sodium 137 135 - 146 mmol/L   Potassium 4.0 3.5 - 5.3 mmol/L   Chloride 101 98 - 110 mmol/L   CO2 28 20 - 32 mmol/L   Calcium 10.1 8.6 - 10.4 mg/dL  EXTRA LAV TOP TUBE     Status: None   Collection Time: 07/19/17  4:05 PM  Result Value Ref Range   EXTRA LAVENDER-TOP  TUBE      Comment: We received an extra specimen with no test requested. If any test is desired for this specimen please call client services and advise.     Assessment/Plan: Essential hypertension BP stable. Asymptomatic. Continue current regimen. Follow-up 3 months for CPE.     Leeanne Rio, PA-C

## 2017-09-12 NOTE — Progress Notes (Signed)
Office Visit Note  Patient: Karen Gregory             Date of Birth: Jan 28, 1966           MRN: [id number]             PCP: Delorse Limber Referring: Brunetta Jeans, PA-C Visit Date: 09/26/2017 Occupation: Teacher, adult education    Subjective:  Swelling in joints..   History of Present Illness: Karen Gregory is a 52 y.o. female seen in consultation per request of her PCP.  According to patient about 2 years ago she gained some weight at the time she had lab work and her TSH was borderline otherwise endocrine work-up was unremarkable.  She was referred to GYN and was placed on estrogen patch.  She states she started working out and lost about 20 pounds.  In November 2018 she had right rotator cuff tear repair.  After that she was not active for next few months and gained about 25 pounds.  She states due to not been active she noticed that her grip strength was poor and also noticed that she had hands and ankle swelling.  In January 2019 she states she had to put some push some boxes at work and the next day she started experiencing increased pain in her hands.  By the following day she had bilateral hands and ankle swelling.  She went to the emergency room where her blood pressure was elevated and she was placed on HCTZ and lisinopril.  After that she was followed up by her PCP who did some lab work and she had elevated hemoglobin she had further lab work which showed elevated ferritin and for that reason she was referred here.  She states she has been also noticing some bruising on her left hand.  Recently she has been having pain and swelling in her bilateral hands especially her right first MCP joint.  She had full respiratory tract infection since January 2019 involving cough.  She states her last prednisone taper was started about 5 days ago at 40 mg for 2 days, 30 mg for 2 days, 20 mg for 2 days and then 10 mg for 3 days she is still on prednisone taper.  She just recently  finished her azithromycin.  None of the other joints are painful.  Activities of Daily Living:  Patient reports morning stiffness for 30 minutes.   Patient Denies nocturnal pain.  Difficulty dressing/grooming: Denies Difficulty climbing stairs: Denies Difficulty getting out of chair: Denies Difficulty using hands for taps, buttons, cutlery, and/or writing: Reports   Review of Systems  Constitutional: Positive for fatigue. Negative for night sweats, weight gain and weight loss.  HENT: Negative for mouth sores, trouble swallowing, trouble swallowing, mouth dryness and nose dryness.   Eyes: Negative for pain, redness, visual disturbance and dryness.  Respiratory: Negative for cough, shortness of breath and difficulty breathing.   Cardiovascular: Negative for chest pain, palpitations, hypertension, irregular heartbeat and swelling in legs/feet.  Gastrointestinal: Positive for constipation. Negative for blood in stool and diarrhea.  Endocrine: Negative for excessive thirst and increased urination.  Genitourinary: Negative for difficulty urinating and vaginal dryness.  Musculoskeletal: Positive for arthralgias, joint pain, joint swelling, muscle weakness and morning stiffness. Negative for myalgias, muscle tenderness and myalgias.  Skin: Positive for hair loss. Negative for color change, rash, skin tightness, ulcers and sensitivity to sunlight.  Allergic/Immunologic: Negative for susceptible to infections.  Neurological: Negative for dizziness, memory loss,  night sweats and weakness.  Hematological: Positive for bruising/bleeding tendency. Negative for swollen glands.  Psychiatric/Behavioral: Positive for sleep disturbance. Negative for depressed mood. The patient is not nervous/anxious.     PMFS History:  Patient Active Problem List   Diagnosis Date Noted  . Nontraumatic incomplete tear of right rotator cuff 09/26/2017  . Chronic fatigue 07/05/2017  . Essential hypertension 07/05/2017  .  Body mass index 29.0-29.9, adult 09/12/2015  . Screening for breast cancer 09/12/2015  . Post menopausal syndrome 10/11/2014  . Abnormal thyroid function test 10/03/2014  . Breast cancer screening 09/10/2014  . Chronic constipation 09/10/2014  . Weight gain 09/10/2014  . Visit for preventive health examination 09/10/2014  . Cervical disc disorder with radiculopathy of cervical region 05/27/2014    Past Medical History:  Diagnosis Date  . Cervical disc disease   . Elevated blood pressure (not hypertension)   . Hypertension   . Impingement syndrome of right shoulder     Family History  Adopted: Yes  Problem Relation Age of Onset  . Heart disease Father        Living  . Fibromyalgia Mother   . Hypertension Mother   . Allergies Brother   . Autoimmune disease Brother   . Healthy Son        x2  . Polycystic ovary syndrome Daughter        x1  . Autoimmune disease Brother   . Autoimmune disease Brother    Past Surgical History:  Procedure Laterality Date  . NECK SURGERY  07/23/2014   Cervical Disk  . SHOULDER SURGERY  2013   Right, Bursa  . TONSILLECTOMY  age 15  . VAGINAL HYSTERECTOMY  03-09-2000   W/ LEFT SALPINGOOPHECTOMY   Social History   Social History Narrative  . Not on file     Objective: Vital Signs: BP (!) 145/92 (BP Location: Left Arm, Patient Position: Sitting, Cuff Size: Normal)   Pulse 64   Resp 16   Ht 5' 2" (1.575 m)   Wt 166 lb (75.3 kg)   BMI 30.36 kg/m    Physical Exam  Constitutional: She is oriented to person, place, and time. She appears well-developed and well-nourished.  HENT:  Head: Normocephalic and atraumatic.  Eyes: Conjunctivae and EOM are normal.  Neck: Normal range of motion.  Cardiovascular: Normal rate, regular rhythm, normal heart sounds and intact distal pulses.  Pulmonary/Chest: Effort normal and breath sounds normal.  Abdominal: Soft. Bowel sounds are normal.  Lymphadenopathy:    She has no cervical adenopathy.    Neurological: She is alert and oriented to person, place, and time.  Skin: Skin is warm and dry. Capillary refill takes less than 2 seconds.  Psychiatric: She has a normal mood and affect. Her behavior is normal.  Nursing note and vitals reviewed.    Musculoskeletal Exam: C-spine thoracic lumbar spine good range of motion.  Shoulder joints elbow joints wrist joint MCPs PIPs DIPs were in good range of motion.  She has some tenderness on palpation over left third and fourth MCP joint.  Hip joints knee joints ankles MTPs PIPs were in good range of motion with no synovitis.  CDAI Exam: No CDAI exam completed.    Investigation: No additional findings. CBC Latest Ref Rng & Units 07/14/2017 07/04/2017 09/12/2015  WBC 4.0 - 10.5 K/uL 5.7 3.5(L) 6.2  Hemoglobin 12.0 - 15.0 g/dL 14.0 16.5(H) 13.4  Hematocrit 36.0 - 46.0 % 40.2 47.7(H) 39.9  Platelets 150.0 - 400.0 K/uL 339.0 313.0  339.0   CMP Latest Ref Rng & Units 07/19/2017 07/04/2017 06/28/2017  Glucose 65 - 99 mg/dL 98 112(H) 103(H)  BUN 7 - 25 mg/dL _0 Creatinine 0.50 - 1.05 mg/dL 0.98 0.93 0.90  Sodium 135 - 146 mmol/L 137 136 137  Potassium 3.5 - 5.3 mmol/L 4.0 4.3 3.6  Chloride 98 - 110 mmol/L 101 96 105  CO2 20 - 32 mmol/L _1 Calcium 8.6 - 10.4 mg/dL 10.1 9.7 8.9  Total Protein 6.0 - 8.3 g/dL - - -  Total Bilirubin 0.2 - 1.2 mg/dL - - -  Alkaline Phos 39 - 117 U/L - - -  AST 0 - 37 U/L - - -  ALT 0 - 35 U/L - - -   July 14 2017% saturation normal, serum ferritin 340, TSH normal, sed rate 9, RF negative Imaging: Xr Ankle 2 Views Left  Result Date: 09/26/2017 No ankle joint narrowing or erosive changes were noted.  A small calcaneal spur was noted. Impression: Unremarkable x-ray of the ankle joint.  Xr Ankle 2 Views Right  Result Date: 09/26/2017 No ankle joint narrowing or erosive changes were noted. Unremarkable x-ray of the ankle joint.  Xr Hand 2 View Left  Result Date: 09/26/2017 Mild PIP and DIP  narrowing was noted.  No MCP, intercarpal radiocarpal joint space narrowing was noted.  No erosive changes were noted. 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  Xr Hand 2 View Right  Result Date: 09/26/2017 Mild PIP and DIP narrowing was noted.  No MCP, intercarpal radiocarpal joint space narrowing was noted.  No erosive changes were noted. 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   Speciality Comments: No specialty comments available.    Procedures:  No procedures performed Allergies: Patient has no known allergies.   Assessment / Plan:     Visit Diagnoses: Polyarthralgia - RF - and Sed rate 9.  Patient gives history of recurrent swelling in her bilateral hands and bilateral ankles for the last few months.  She is currently on prednisone taper for upper respiratory tract infection.  I do not see any swelling or synovitis on examination.  She has mild tenderness over left fourth MCP joint.  Pain in both hands - Plan: XR Hand 2 View Right, XR Hand 2 View Left, x-ray showed only mild osteoarthritic changes.  Angiotensin converting enzyme, Uric acid, Cyclic citrul peptide antibody, IgG, 14-3-3 eta Protein, ANA, HLA-B27 antigen.  I would like to schedule ultrasound of bilateral hands to look for synovitis.  She is on prednisone have advised her to wait about a month after prednisone to get the ultrasound.  We will make assessment after that.  Chronic pain of both ankles - Plan: XR Ankle 2 Views Right, XR Ankle 2 Views Left x-ray of bilateral ankle joints were unremarkable.  Chronic fatigue - Plan: Urinalysis, Routine w reflex microscopic, CK, Magnesium  Nontraumatic incomplete tear of right rotator cuff - Status post repair November 2018  DDD (degenerative disc disease), cervical - Status post fusion March 2016   Essential hypertension -she is on medications.  Her blood pressure is a still  elevated.   Orders: Orders Placed This Encounter  Procedures  . XR Hand 2 View Right  . XR Hand 2 View Left  . XR Ankle 2 Views Right  . XR Ankle 2 Views Left  . Urinalysis, Routine w reflex microscopic  . CK  . Magnesium  .  Angiotensin converting enzyme  . Uric acid  . Cyclic citrul peptide antibody, IgG  . 14-3-3 eta Protein  . ANA  . HLA-B27 antigen   No orders of the defined types were placed in this encounter.   Face-to-face time spent with patient was 50 minutes. >50% of time was spent in counseling and coordination of care.  Follow-Up Instructions: Return for Inflammatory arthritis.   Bo Merino, MD  Note - This record has been created using Editor, commissioning.  Chart creation errors have been sought, but may not always  have been located. Such creation errors do not reflect on  the standard of medical care.

## 2017-09-22 ENCOUNTER — Other Ambulatory Visit: Payer: Self-pay

## 2017-09-22 ENCOUNTER — Ambulatory Visit: Payer: Managed Care, Other (non HMO) | Admitting: Family Medicine

## 2017-09-22 ENCOUNTER — Encounter: Payer: Self-pay | Admitting: Family Medicine

## 2017-09-22 VITALS — Temp 98.3°F | Ht 62.0 in | Wt 161.0 lb

## 2017-09-22 DIAGNOSIS — B9689 Other specified bacterial agents as the cause of diseases classified elsewhere: Secondary | ICD-10-CM | POA: Diagnosis not present

## 2017-09-22 DIAGNOSIS — J208 Acute bronchitis due to other specified organisms: Secondary | ICD-10-CM | POA: Diagnosis not present

## 2017-09-22 DIAGNOSIS — R05 Cough: Secondary | ICD-10-CM

## 2017-09-22 DIAGNOSIS — R053 Chronic cough: Secondary | ICD-10-CM

## 2017-09-22 MED ORDER — GUAIFENESIN-CODEINE 100-10 MG/5ML PO SOLN: 5.0000 mL | Freq: Four times a day (QID) | ORAL | 0 refills | Status: DC | PRN

## 2017-09-22 MED ORDER — BENZONATATE 100 MG PO CAPS: 100.0000 mg | ORAL_CAPSULE | Freq: Two times a day (BID) | ORAL | 0 refills | Status: DC | PRN

## 2017-09-22 MED ORDER — PREDNISONE 10 MG PO TABS: ORAL_TABLET | ORAL | 0 refills | Status: DC

## 2017-09-22 MED ORDER — AZITHROMYCIN 250 MG PO TABS: ORAL_TABLET | ORAL | 0 refills | Status: DC

## 2017-09-22 NOTE — Progress Notes (Signed)
zx

## 2017-09-22 NOTE — Patient Instructions (Signed)
Please follow up if symptoms do not improve or as needed.    Acute Bronchitis, Adult Acute bronchitis is sudden (acute) swelling of the air tubes (bronchi) in the lungs. Acute bronchitis causes these tubes to fill with mucus, which can make it hard to breathe. It can also cause coughing or wheezing. In adults, acute bronchitis usually goes away within 2 weeks. A cough caused by bronchitis may last up to 3 weeks. Smoking, allergies, and asthma can make the condition worse. Repeated episodes of bronchitis may cause further lung problems, such as chronic obstructive pulmonary disease (COPD). What are the causes? This condition can be caused by germs and by substances that irritate the lungs, including:  Cold and flu viruses. This condition is most often caused by the same virus that causes a cold.  Bacteria.  Exposure to tobacco smoke, dust, fumes, and air pollution.  What increases the risk? This condition is more likely to develop in people who:  Have close contact with someone with acute bronchitis.  Are exposed to lung irritants, such as tobacco smoke, dust, fumes, and vapors.  Have a weak immune system.  Have a respiratory condition such as asthma.  What are the signs or symptoms? Symptoms of this condition include:  A cough.  Coughing up clear, yellow, or green mucus.  Wheezing.  Chest congestion.  Shortness of breath.  A fever.  Body aches.  Chills.  A sore throat.  How is this diagnosed? This condition is usually diagnosed with a physical exam. During the exam, your health care provider may order tests, such as chest X-rays, to rule out other conditions. He or she may also:  Test a sample of your mucus for bacterial infection.  Check the level of oxygen in your blood. This is done to check for pneumonia.  Do a chest X-ray or lung function testing to rule out pneumonia and other conditions.  Perform blood tests.  Your health care provider will also ask  about your symptoms and medical history. How is this treated? Most cases of acute bronchitis clear up over time without treatment. Your health care provider may recommend:  Drinking more fluids. Drinking more makes your mucus thinner, which may make it easier to breathe.  Taking a medicine for a fever or cough.  Taking an antibiotic medicine.  Using an inhaler to help improve shortness of breath and to control a cough.  Using a cool mist vaporizer or humidifier to make it easier to breathe.  Follow these instructions at home: Medicines  Take over-the-counter and prescription medicines only as told by your health care provider.  If you were prescribed an antibiotic, take it as told by your health care provider. Do not stop taking the antibiotic even if you start to feel better. General instructions  Get plenty of rest.  Drink enough fluids to keep your urine clear or pale yellow.  Avoid smoking and secondhand smoke. Exposure to cigarette smoke or irritating chemicals will make bronchitis worse. If you smoke and you need help quitting, ask your health care provider. Quitting smoking will help your lungs heal faster.  Use an inhaler, cool mist vaporizer, or humidifier as told by your health care provider.  Keep all follow-up visits as told by your health care provider. This is important. How is this prevented? To lower your risk of getting this condition again:  Wash your hands often with soap and water. If soap and water are not available, use hand sanitizer.  Avoid contact with   people who have cold symptoms.  Try not to touch your hands to your mouth, nose, or eyes.  Make sure to get the flu shot every year.  Contact a health care provider if:  Your symptoms do not improve in 2 weeks of treatment. Get help right away if:  You cough up blood.  You have chest pain.  You have severe shortness of breath.  You become dehydrated.  You faint or keep feeling like you are  going to faint.  You keep vomiting.  You have a severe headache.  Your fever or chills gets worse. This information is not intended to replace advice given to you by your health care provider. Make sure you discuss any questions you have with your health care provider. Document Released: 06/10/2004 Document Revised: 11/26/2015 Document Reviewed: 10/22/2015 [employer]vier Interactive Patient Education  2018 [employer]   

## 2017-09-22 NOTE — Progress Notes (Signed)
Subjective  CC:  Chief Complaint  Patient presents with  . Cough    productive cough with green mucus, cough x 2 months     HPI: SUBJECTIVE:  Karen Gregory is a 52 y.o. female who complains of congestion, nasal blockage, post nasal drip, cough described as barky, harsh and productive and denies sinus, high fevers, SOB, chest pain or significant GI symptoms. Symptoms have been present for 3-4 days although she reports has had a dry cough on and off since February after having a flu like illness. Also has had 2 URI with cough illnesses prior to his illness. Feels like she has had some wheezing. She denies a history of anorexia, dizziness, vomiting and wheezing. She denies a history of asthma or COPD. Patient does not smoke cigarettes.  I reviewed the patients updated PMH, FH, and SocHx.  Social History: Patient  reports that she has never smoked. She has never used smokeless tobacco. She reports that she drinks alcohol. She reports that she does not use drugs.  Patient Active Problem List   Diagnosis Date Noted  . Chronic fatigue 07/05/2017  . Essential hypertension 07/05/2017  . Body mass index 29.0-29.9, adult 09/12/2015  . Screening for breast cancer 09/12/2015  . Post menopausal syndrome 10/11/2014  . Abnormal thyroid function test 10/03/2014  . Breast cancer screening 09/10/2014  . Chronic constipation 09/10/2014  . Weight gain 09/10/2014  . Visit for preventive health examination 09/10/2014  . Cervical disc disorder with radiculopathy of cervical region 05/27/2014    Review of Systems: Cardiovascular: negative for chest pain Respiratory: negative for SOB or hemoptysis Gastrointestinal: negative for abdominal pain Genitourinary: negative for dysuria or gross hematuria Current Meds  Medication Sig  . Cholecalciferol (VITAMIN D3) 5000 units CAPS Take 5,000 Units by mouth daily.  Marland Kitchen estradiol (VIVELLE-DOT) 0.1 MG/24HR patch Place 1 patch onto the skin once a week.  Marland Kitchen HM  OMEGA-3-6-9 FATTY ACIDS CAPS Take 2 each by mouth daily. Omega 3-5-6-7-9  . hydrochlorothiazide (HYDRODIURIL) 12.5 MG tablet TAKE 1 TABLET BY MOUTH EVERY DAY  . lisinopril (PRINIVIL,ZESTRIL) 20 MG tablet TAKE 1 TABLET BY MOUTH EVERY DAY  . Nutritional Supplements (JUICE PLUS FIBRE PO) Take 2 capsules by mouth daily. 2 Berries, 2 Vegs, 2 Fruits  . Probiotic Product (ALIGN) 4 MG CAPS Take 1 capsule by mouth daily.  . Turmeric 500 MG CAPS Take 1 capsule by mouth daily.    Objective  Vitals: Temp 98.3 F (36.8 C)   Ht  (1.575 m)   Wt 161 lb (73 kg)   SpO2 98%   BMI 29.45 kg/m  General: no acute distress ,  No respiratory distress. Non-toxic Psych:  Alert and oriented, normal mood and affect HEENT:  Normocephalic, atraumatic, supple neck, moist mucous membranes, mildly erythematous pharynx without exudate, mild lymphadenopathy, supple neck Cardiovascular:  RRR without murmur. no edema Respiratory:  Good breath sounds bilaterally, CTAB with normal respiratory effort without wheeze or rhonchi Skin:  Warm, no rashes Neurologic:   Mental status is normal. normal gait  Assessment  1. Acute bacterial bronchitis   2. Cough, persistent      Plan  Discussion:  Treat for bacterial bronchitis due to prolonged course and worsening symptoms. Add pred due to postinflammatory cough sxs and subjective wheeze. Should resolve. IF not, return for further evaluation of chronic cough.  Education regarding differences between viral and bacterial infections and treatment options are discussed.  Supportive care measures are recommended.  We discussed the  use of mucolytic's, decongestants, antihistamines and antitussives as needed.  Tylenol or Advil are recommended if needed.  Follow up: Return if symptoms worsen or fail to improve.   Commons side effects, risks, benefits, and alternatives for medications and treatment plan prescribed today were discussed, and the patient expressed understanding of the  given instructions. Patient is instructed to call or message via MyChart if he/she has any questions or concerns regarding our treatment plan. No barriers to understanding were identified. We discussed Red Flag symptoms and signs in detail. Patient expressed understanding regarding what to do in case of urgent or emergency type symptoms.  Medication list was reconciled, printed and provided to the patient in AVS. Patient instructions and summary information was reviewed with the patient as documented in the AVS. This note was prepared with assistance of Dragon voice recognition software. Occasional wrong-word or sound-a-like substitutions may have occurred due to the inherent limitations of voice recognition software  No orders of the defined types were placed in this encounter.  Meds ordered this encounter  Medications  . guaiFENesin-codeine 100-10 MG/5ML syrup    Sig: Take 5 mLs by mouth every 6 (six) hours as needed for cough.    Dispense:  120 mL    Refill:  0  . benzonatate (TESSALON) 100 MG capsule    Sig: Take 1 capsule (100 mg total) by mouth 2 (two) times daily as needed for cough.    Dispense:  20 capsule    Refill:  0  . azithromycin (ZITHROMAX) 250 MG tablet    Sig: Take 2 tabs today, then 1 tab daily for 4 days    Dispense:  1 each    Refill:  0  . predniSONE (DELTASONE) 10 MG tablet    Sig: Take 4 tabs qd x 2 days, 3 qd x 2 days, 2 qd x 2d, 1qd x 3 days    Dispense:  21 tablet    Refill:  0

## 2017-09-26 ENCOUNTER — Ambulatory Visit (INDEPENDENT_AMBULATORY_CARE_PROVIDER_SITE_OTHER): Payer: Self-pay

## 2017-09-26 ENCOUNTER — Ambulatory Visit (INDEPENDENT_AMBULATORY_CARE_PROVIDER_SITE_OTHER): Payer: Managed Care, Other (non HMO)

## 2017-09-26 ENCOUNTER — Encounter: Payer: Self-pay | Admitting: Rheumatology

## 2017-09-26 ENCOUNTER — Ambulatory Visit: Payer: Managed Care, Other (non HMO) | Admitting: Rheumatology

## 2017-09-26 VITALS — BP 145/92 | HR 64 | Resp 16 | Ht 62.0 in | Wt 166.0 lb

## 2017-09-26 DIAGNOSIS — M5030 Other cervical disc degeneration, unspecified cervical region: Secondary | ICD-10-CM

## 2017-09-26 DIAGNOSIS — M25571 Pain in right ankle and joints of right foot: Secondary | ICD-10-CM | POA: Diagnosis not present

## 2017-09-26 DIAGNOSIS — M79642 Pain in left hand: Secondary | ICD-10-CM

## 2017-09-26 DIAGNOSIS — M75111 Incomplete rotator cuff tear or rupture of right shoulder, not specified as traumatic: Secondary | ICD-10-CM

## 2017-09-26 DIAGNOSIS — R5382 Chronic fatigue, unspecified: Secondary | ICD-10-CM | POA: Diagnosis not present

## 2017-09-26 DIAGNOSIS — G8929 Other chronic pain: Secondary | ICD-10-CM

## 2017-09-26 DIAGNOSIS — I10 Essential (primary) hypertension: Secondary | ICD-10-CM

## 2017-09-26 DIAGNOSIS — M79641 Pain in right hand: Secondary | ICD-10-CM | POA: Diagnosis not present

## 2017-09-26 DIAGNOSIS — M25572 Pain in left ankle and joints of left foot: Secondary | ICD-10-CM

## 2017-09-26 DIAGNOSIS — M2550 Pain in unspecified joint: Secondary | ICD-10-CM | POA: Diagnosis not present

## 2017-09-27 ENCOUNTER — Other Ambulatory Visit: Payer: Self-pay | Admitting: Physician Assistant

## 2017-09-27 ENCOUNTER — Other Ambulatory Visit: Payer: Self-pay

## 2017-09-27 DIAGNOSIS — I10 Essential (primary) hypertension: Secondary | ICD-10-CM

## 2017-09-29 LAB — MAGNESIUM: Magnesium: 1.9 mg/dL (ref 1.5–2.5)

## 2017-09-29 LAB — URINALYSIS, ROUTINE W REFLEX MICROSCOPIC
Bilirubin Urine: NEGATIVE
Glucose, UA: NEGATIVE
HGB URINE DIPSTICK: NEGATIVE
Ketones, ur: NEGATIVE
LEUKOCYTES UA: NEGATIVE
NITRITE: NEGATIVE
PH: 6.5 (ref 5.0–8.0)
Protein, ur: NEGATIVE
SPECIFIC GRAVITY, URINE: 1.025 (ref 1.001–1.03)

## 2017-09-29 LAB — 14-3-3 ETA PROTEIN

## 2017-09-29 LAB — CYCLIC CITRUL PEPTIDE ANTIBODY, IGG

## 2017-09-29 LAB — CK: CK TOTAL: 20 U/L — AB (ref 29–143)

## 2017-09-29 LAB — ANA: Anti Nuclear Antibody(ANA): NEGATIVE

## 2017-09-29 LAB — URIC ACID: URIC ACID, SERUM: 4.8 mg/dL (ref 2.5–7.0)

## 2017-09-29 LAB — HLA-B27 ANTIGEN: HLA-B27 ANTIGEN: NEGATIVE

## 2017-09-29 LAB — ANGIOTENSIN CONVERTING ENZYME: Angiotensin-Converting Enzyme: 9 U/L (ref 9–67)

## 2017-10-19 NOTE — Progress Notes (Signed)
Ultrasound examination of bilateral hands was performed per EULAR recommendations. Using 12 MHz transducer, grayscale and power Doppler bilateral second, third, and fifth MCP joints and bilateral wrist joints both dorsal and volar aspects were evaluated to look for synovitis or tenosynovitis. The findings were there was no synovitis or tenosynovitis on ultrasound examination. Right median nerve was 0.06 cm squares which was within normal limits and left median nerve was 0.07 cm squares which was within normal limits.  Impression: Ultrasound examination did not show any synovitis Karen SavoyShaili Blima Jaimes, MD

## 2017-10-26 ENCOUNTER — Ambulatory Visit (INDEPENDENT_AMBULATORY_CARE_PROVIDER_SITE_OTHER): Payer: Self-pay

## 2017-10-26 ENCOUNTER — Ambulatory Visit: Payer: Managed Care, Other (non HMO) | Admitting: Rheumatology

## 2017-10-26 DIAGNOSIS — M79642 Pain in left hand: Secondary | ICD-10-CM

## 2017-10-26 DIAGNOSIS — M79641 Pain in right hand: Secondary | ICD-10-CM

## 2017-10-26 NOTE — Progress Notes (Signed)
Office Visit Note  Patient: Karen Gregory             Date of Birth: 08/30/1965           MRN: [id number]             PCP: Brunetta Jeans, PA-C Referring: Brunetta Jeans, PA-C Visit Date: 10/27/2017 Occupation: _0 @    Subjective:  Pain in hands and ankles.   History of Present Illness: Karen Gregory is a 52 y.o. female with history of osteoarthritis and polyarthralgia.  She states she continues to have some discomfort in her hands.  She has noticed some bruising on her knuckles.  She has been also having discomfort in her C-spine.  She has noticed few episodes of dizziness when she gets up from sitting position.  She started taking antihypertensives few months back.  She is also noticed change in her vision.  Has been also experiencing some coccyx pain.  Activities of Daily Living:  Patient reports morning stiffness for 30 minutes.   Patient Denies nocturnal pain.  Difficulty dressing/grooming: Denies Difficulty climbing stairs: Denies Difficulty getting out of chair: Denies Difficulty using hands for taps, buttons, cutlery, and/or writing: Reports   Review of Systems  Constitutional: Negative for activity change, fatigue, night sweats, weight gain and weight loss.  HENT: Positive for mouth dryness. Negative for mouth sores, trouble swallowing, trouble swallowing and nose dryness.   Eyes: Negative for pain, redness, visual disturbance and dryness.  Respiratory: Negative for cough, shortness of breath and difficulty breathing.   Cardiovascular: Negative for chest pain, palpitations, hypertension, irregular heartbeat and swelling in legs/feet.  Gastrointestinal: Positive for constipation. Negative for blood in stool and diarrhea.  Endocrine: Positive for excessive thirst and increased urination.  Genitourinary: Negative for difficulty urinating and vaginal dryness.  Musculoskeletal: Positive for arthralgias, gait problem, joint pain, muscle weakness, morning  stiffness and muscle tenderness. Negative for joint swelling, myalgias and myalgias.  Skin: Positive for hair loss. Negative for color change, rash, skin tightness, ulcers and sensitivity to sunlight.  Allergic/Immunologic: Negative for susceptible to infections.  Neurological: Positive for dizziness, light-headedness and weakness. Negative for memory loss and night sweats.  Hematological: Positive for bruising/bleeding tendency. Negative for swollen glands.  Psychiatric/Behavioral: Positive for sleep disturbance. Negative for depressed mood. The patient is not nervous/anxious.     PMFS History:  Patient Active Problem List   Diagnosis Date Noted  . Nontraumatic incomplete tear of right rotator cuff 09/26/2017  . Chronic fatigue 07/05/2017  . Essential hypertension 07/05/2017  . Body mass index 29.0-29.9, adult 09/12/2015  . Screening for breast cancer 09/12/2015  . Post menopausal syndrome 10/11/2014  . Abnormal thyroid function test 10/03/2014  . Breast cancer screening 09/10/2014  . Chronic constipation 09/10/2014  . Weight gain 09/10/2014  . Visit for preventive health examination 09/10/2014  . Cervical disc disorder with radiculopathy of cervical region 05/27/2014    Past Medical History:  Diagnosis Date  . Cervical disc disease   . Elevated blood pressure (not hypertension)   . Hypertension   . Impingement syndrome of right shoulder     Family History  Adopted: Yes  Problem Relation Age of Onset  . Heart disease Father        Living  . Fibromyalgia Mother   . Hypertension Mother   . Allergies Brother   . Autoimmune disease Brother   . Healthy Son        x2  . Polycystic ovary syndrome Daughter  x1  . Autoimmune disease Brother   . Autoimmune disease Brother    Past Surgical History:  Procedure Laterality Date  . NECK SURGERY  07/23/2014   Cervical Disk  . SHOULDER SURGERY  2013   Right, Bursa  . TONSILLECTOMY  age 21  . TOTAL SHOULDER ARTHROPLASTY      . VAGINAL HYSTERECTOMY  03-09-2000   W/ LEFT SALPINGOOPHECTOMY   Social History   Social History Narrative  . Not on file     Objective: Vital Signs: BP 123/75 (BP Location: Left Arm, Patient Position: Sitting, Cuff Size: Normal)   Pulse 64   Resp 14   Ht _0  (1.575 m)   Wt 163 lb (73.9 kg)   BMI 29.81 kg/m    Physical Exam  Constitutional: She is oriented to person, place, and time. She appears well-developed and well-nourished.  HENT:  Head: Normocephalic and atraumatic.  Eyes: Conjunctivae and EOM are normal.  Neck: Normal range of motion.  Cardiovascular: Normal rate, regular rhythm, normal heart sounds and intact distal pulses.  Pulmonary/Chest: Effort normal and breath sounds normal.  Abdominal: Soft. Bowel sounds are normal.  Lymphadenopathy:    She has no cervical adenopathy.  Neurological: She is alert and oriented to person, place, and time.  Skin: Skin is warm and dry. Capillary refill takes less than 2 seconds.  Psychiatric: She has a normal mood and affect. Her behavior is normal.  Nursing note and vitals reviewed.    Musculoskeletal Exam: C-spine thoracic lumbar spine good range of motion.  She has some tenderness over the coccyx area.  Shoulder joints elbow joints wrist joints are good range of motion.  She had DIP thickening in her bilateral hands consistent with osteoarthritis.  Hip joints knee joints ankles MTPs PIPs are good range of motion with no synovitis.  CDAI Exam: No CDAI exam completed.    Investigation: No additional findings. 13 2019 UA negative, CK 20, magnesium normal, ACE 9, uric acid 4.8, anti-CCP negative, _1 at a negative, ANA negative, HLA-B27 negative Ultrasound bilateral hands negative for synovitis.  Imaging: [place] Extrem Up Bilat Comp  Result Date: 10/26/2017 Ultrasound examination of bilateral hands was performed per EULAR recommendations. Using 12 MHz transducer, grayscale and power Doppler bilateral second, third, and  fifth MCP joints and bilateral wrist joints both dorsal and volar aspects were evaluated to look for synovitis or tenosynovitis. The findings were there was no synovitis or tenosynovitis on ultrasound examination. Right median nerve was 0.06 cm squares which was within normal limits and left median nerve was 0.07 cm squares which was within normal limits. Impression: Ultrasound examination did not show any synovitis.   Speciality Comments: No specialty comments available.    Procedures:  No procedures performed Allergies: Patient has no known allergies.   Assessment / Plan:     Visit Diagnoses: Polyarthralgia - History of intermittent bilateral hands and ankle joint swelling.  All autoimmune work-up negative.  Ultrasound obtained yesterday of bilateral hands was negative for synovitis.  I believe most of her discomfort is coming from underlying osteoarthritis.  I did not see any swelling on examination.  Primary osteoarthritis of both hands - Mild.  Joint protection muscle strengthening was discussed.  Hand muscle strengthening exercises were demonstrated in the office.  A list of natural anti-inflammatories was given.  Coccygodynia-I offered physical therapy which she declined.  I have advised her to use a coccyx cushion or soft cushion while she sits on a hard surface.  Cervical disc disorder with radiculopathy of cervical region-chronic pain  Nontraumatic incomplete tear of right rotator cuff  Chronic fatigue-patient complains of fatigue and dizziness.  I offered referral to neurology but she declined.  She states she will discuss with her PCP.  She is also noticed some vision change.  I have advised her to schedule an appointment with ophthalmologist.  Essential hypertension -she recently started some antihypertensives.  Orders: No orders of the defined types were placed in this encounter.  No orders of the defined types were placed in this encounter.   Face-to-face time spent  with patient was 30 minutes. >50% of time was spent in counseling and coordination of care.  Follow-Up Instructions: No follow-ups on file.   Bo Merino, MD  Note - This record has been created using Editor, commissioning.  Chart creation errors have been sought, but may not always  have been located. Such creation errors do not reflect on  the standard of medical care.

## 2017-10-27 ENCOUNTER — Ambulatory Visit: Payer: Managed Care, Other (non HMO) | Admitting: Rheumatology

## 2017-10-27 ENCOUNTER — Encounter: Payer: Self-pay | Admitting: Rheumatology

## 2017-10-27 VITALS — BP 123/75 | HR 64 | Resp 14 | Ht 62.0 in | Wt 163.0 lb

## 2017-10-27 DIAGNOSIS — I10 Essential (primary) hypertension: Secondary | ICD-10-CM

## 2017-10-27 DIAGNOSIS — M5010 Cervical disc disorder with radiculopathy, unspecified cervical region: Secondary | ICD-10-CM | POA: Diagnosis not present

## 2017-10-27 DIAGNOSIS — M75111 Incomplete rotator cuff tear or rupture of right shoulder, not specified as traumatic: Secondary | ICD-10-CM | POA: Diagnosis not present

## 2017-10-27 DIAGNOSIS — R5382 Chronic fatigue, unspecified: Secondary | ICD-10-CM

## 2017-10-27 DIAGNOSIS — M2550 Pain in unspecified joint: Secondary | ICD-10-CM

## 2017-10-27 DIAGNOSIS — M19042 Primary osteoarthritis, left hand: Secondary | ICD-10-CM | POA: Diagnosis not present

## 2017-10-27 DIAGNOSIS — M533 Sacrococcygeal disorders, not elsewhere classified: Secondary | ICD-10-CM | POA: Diagnosis not present

## 2017-10-27 DIAGNOSIS — M19041 Primary osteoarthritis, right hand: Secondary | ICD-10-CM | POA: Diagnosis not present

## 2017-10-27 NOTE — Patient Instructions (Signed)
Natural anti-inflammatories  You can purchase these at [employer], [employer] or online.  . Turmeric (capsules)  . Ginger (ginger root or capsules)  . Omega 3 (Fish, flax seeds, chia seeds, walnuts, almonds)  . Tart cherry (dried or extract)   Patient should be under the care of a physician while taking these supplements. This may not be reproduced without the permission of Dr. Janila Arrazola.  

## 2017-10-28 ENCOUNTER — Other Ambulatory Visit: Payer: Self-pay | Admitting: Physician Assistant

## 2017-10-28 DIAGNOSIS — I10 Essential (primary) hypertension: Secondary | ICD-10-CM

## 2017-11-10 ENCOUNTER — Ambulatory Visit: Payer: Managed Care, Other (non HMO) | Admitting: Rheumatology

## 2017-11-21 ENCOUNTER — Other Ambulatory Visit: Payer: Self-pay | Admitting: Physician Assistant

## 2017-11-21 ENCOUNTER — Encounter: Payer: Managed Care, Other (non HMO) | Admitting: Physician Assistant

## 2017-11-21 DIAGNOSIS — I10 Essential (primary) hypertension: Secondary | ICD-10-CM

## 2017-11-28 ENCOUNTER — Telehealth: Payer: Self-pay | Admitting: Emergency Medicine

## 2017-11-28 NOTE — Telephone Encounter (Signed)
LMOVM to verify if patient is taking HCTZ 12.5 mg or 235 mg daily. We sent 90 day supply of the 12.5 to patient pharmacy. Receiving request from pharmacy for the 25 mg.

## 2018-01-09 ENCOUNTER — Other Ambulatory Visit: Payer: Self-pay

## 2018-01-09 ENCOUNTER — Ambulatory Visit: Payer: Self-pay | Admitting: Physician Assistant

## 2018-01-09 ENCOUNTER — Encounter: Payer: Self-pay | Admitting: Physician Assistant

## 2018-01-09 ENCOUNTER — Ambulatory Visit: Payer: Managed Care, Other (non HMO) | Admitting: Physician Assistant

## 2018-01-09 VITALS — BP 114/78 | HR 59 | Temp 97.5°F | Resp 16 | Ht 62.0 in | Wt 160.4 lb

## 2018-01-09 DIAGNOSIS — W540XXA Bitten by dog, initial encounter: Secondary | ICD-10-CM | POA: Diagnosis not present

## 2018-01-09 DIAGNOSIS — Z23 Encounter for immunization: Secondary | ICD-10-CM | POA: Diagnosis not present

## 2018-01-09 DIAGNOSIS — S0993XA Unspecified injury of face, initial encounter: Secondary | ICD-10-CM

## 2018-01-09 MED ORDER — AMOXICILLIN-POT CLAVULANATE 875-125 MG PO TABS: 1.0000 | ORAL_TABLET | Freq: Two times a day (BID) | ORAL | 0 refills | Status: DC

## 2018-01-09 NOTE — Patient Instructions (Signed)
Please keep skin clean and dry.  Clean the area daily with warm clean water and soap. Rinse well and pat dry. Apply a very thin layer of neosporin over the area.  Take the antibiotic twice daily with food. Consider starting a probiotic.  Your tetanus has been updated today.  Follow-up with me if you note increased tenderness, warmth or induration (hardness).

## 2018-01-09 NOTE — Progress Notes (Signed)
Patient presents to clinic today c/o dog bite early this morning around 6 AM. Was petting her new rescue dog when she leaned in to his face, startling him, leading to the bite. Noted the area stopped bleeding quickly. Has noted pain at the site. Denies drainage. Denies fever, chills, malaise. The dog is up-to-date on all immunizations.   Past Medical History:  Diagnosis Date  . Cervical disc disease   . Elevated blood pressure (not hypertension)   . Hypertension   . Impingement syndrome of right shoulder     Current Outpatient Medications on File Prior to Visit  Medication Sig Dispense Refill  . Cholecalciferol (VITAMIN D3) 5000 units CAPS Take 5,000 Units by mouth daily.    Marland Kitchen estradiol (VIVELLE-DOT) 0.1 MG/24HR patch Place 1 patch onto the skin once a week.    Marland Kitchen HM OMEGA-3-6-9 FATTY ACIDS CAPS Take 2 each by mouth daily. Omega 3-5-6-7-9    . hydrochlorothiazide (HYDRODIURIL) 12.5 MG tablet TAKE 1 TABLET BY MOUTH EVERY DAY 90 tablet 1  . lisinopril (PRINIVIL,ZESTRIL) 20 MG tablet TAKE 1 TABLET BY MOUTH EVERY DAY (Patient taking differently: Take 20 mg by mouth daily. ) 90 tablet 1  . Nutritional Supplements (JUICE PLUS FIBRE PO) Take 2 capsules by mouth daily. 2 Berries, 2 Vegs, 2 Fruits    . Probiotic Product (ALIGN) 4 MG CAPS Take 1 capsule by mouth daily.    . Turmeric 500 MG CAPS Take 1 capsule by mouth daily.    . hydrochlorothiazide (HYDRODIURIL) 25 MG tablet      No current facility-administered medications on file prior to visit.     No Known Allergies  Family History  Adopted: Yes  Problem Relation Age of Onset  . Heart disease Father        Living  . Fibromyalgia Mother   . Hypertension Mother   . Allergies Brother   . Autoimmune disease Brother   . Healthy Son        x2  . Polycystic ovary syndrome Daughter        x1  . Autoimmune disease Brother   . Autoimmune disease Brother     Social History   Socioeconomic History  . Marital status: Divorced   Spouse name: Not on file  . Number of children: Not on file  . Years of education: Not on file  . 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 Financial resource strain: Not on file  . Food insecurity:    Worry: Not on file    Inability: Not on file  . Transportation needs:    Medical: Not on file    Non-medical: Not on file  Tobacco Use  . Smoking status: Never Smoker  . Smokeless tobacco: Never Used  Substance and Sexual Activity  . Alcohol use: Yes    Alcohol/week: 0.0 standard drinks    Comment: rare  . Drug use: No  . Sexual activity: Yes    Birth control/protection: Surgical  Lifestyle  . Physical activity:    Days per week: Not on file    Minutes per session: Not on file  . Stress: Not on file  Relationships  . Social connections:    Talks on phone: Not on file    Gets together: Not on file    Attends religious service: Not on file    Active member of club or organization: Not on file  Attends meetings of clubs or organizations: Not on file    Relationship status: Not on file  Other Topics Concern  . Not on file  Social History Narrative  . Not on file   Review of Systems - See HPI.  All other ROS are negative.  BP 114/78   Pulse (!) 59   Temp (!) 97.5 F (36.4 C) (Oral)   Resp 16   Ht 5\' 2"  (1.575 m)   Wt 160 lb 6.4 oz (72.8 kg)   SpO2 99%   BMI 29.34 kg/m   Physical Exam  Constitutional: She is oriented to person, place, and time. She appears well-developed and well-nourished.  HENT:  Head: Normocephalic and atraumatic.  Eyes: Conjunctivae are normal.  Neck: Neck supple.  Cardiovascular: Normal rate, regular rhythm, normal heart sounds and intact distal pulses.  Pulmonary/Chest: Effort normal and breath sounds normal.  Neurological: She is alert and oriented to person, place, and time.  Vitals reviewed.  Assessment/Plan: 1. Dog bite, initial encounter Area cleaned  and explored. Puncture wound of left nares and R upper lip. No laceration noted. TDaP updated. Rx Augmentin sent in. Supportive measures and OTC medications reviewed. Follow-up PRN.   - amoxicillin-clavulanate (AUGMENTIN) 875-125 MG tablet; Take 1 tablet by mouth 2 (two) times daily.  Dispense: 14 tablet; Refill: 0 - Tdap vaccine greater than or equal to 7yo IM   Piedad ClimesWilliam Cody Harjot Zavadil, PA-C

## 2018-01-09 NOTE — Telephone Encounter (Signed)
I returned her call.   Her dog bit her this morning about 6:30am.   He bit her on her upper lip and left nostral.   She has 2 punctures on upper lip on inside and outside.   She has a puncture mark on the left side of her nose that she is not sure if it went through or not.    She is concerned mostly because her upper lip is continuing to swell.   It's also painful. Her dog is up to date on his rabies vaccine.   She has not had a tetanus shot during the last 10 years.   See triage notes.  I scheduled her with Chrissie NoaWilliam "Selena Battenody" Daphine DeutscherMartin, PA-C for today at 3:00.      I instructed her to go to the ED if she starts feeling short of breath, chest pain, or swelling starts inside her mouth or throat beyond just her lip.   She verbalized understanding.  [facility].  Reason for Disposition . [1] Puncture wound or small cut AND [2] on face  Answer Assessment - Initial Assessment Questions 1. ANIMAL: "What type of animal caused the bite?" "Is the injury from a bite or a claw?" If the animal is a dog or a cat, ask: "Was it a pet or a stray?" "Was it acting ill or behaving strangely?"     Had dog for a month and a half.   Sitz Shue.   My husband was bitten in face last week.      We were laying in bed like every morning.   He was on my lap licking my check. 2. LOCATION: "Where is the bite located?"      Got my nose and lip.   Lip is very swollen.   2 cuts on top lip.  Like punctures on inside and outside of my lip.   My nose on the left side.   I'm not sure if it went all the way through.    He is up to date on his rabies shot. 3. SIZE: "How big is the bite?" "What does it look like?"      Puncture bites.  Happened at 6:30am.   My lip is the worst with swelling. 4. ONSET: "When did the bite happen?" (Minutes or hours ago)      6:30am this morning. 5. CIRCUMSTANCES: "Tell me how this happened."      See above. 6. TETANUS: "When was the last tetanus booster?"     I don't  know.    7. PREGNANCY: "Is there any chance you are pregnant?" "When was your last menstrual period?"     No  Protocols used: ANIMAL BITE-A-AH

## 2018-01-09 NOTE — Telephone Encounter (Signed)
FYI, appt at 3pm.   Kathi SimpersAmy Advait Buice,  LPN

## 2018-03-07 ENCOUNTER — Telehealth: Payer: Self-pay | Admitting: Physician Assistant

## 2018-03-07 DIAGNOSIS — I10 Essential (primary) hypertension: Secondary | ICD-10-CM

## 2018-03-07 MED ORDER — HYDROCHLOROTHIAZIDE 12.5 MG PO TABS: 12.5000 mg | ORAL_TABLET | Freq: Every day | ORAL | 1 refills | Status: DC

## 2018-03-07 NOTE — Telephone Encounter (Signed)
Per last OV note, patient was to be taking the 12.5mg  tablet once daily.   She was also supposed to schedule her CPE with cody martin.   I have LM for patient to call back.... Need to verify if she was truly doubling her medication, if so I  Need to get advise from a provider.  Patient also needs to schedule CPE with PCP.  Okay for PEC to discuss this with patient and schedule appointment.

## 2018-03-07 NOTE — Telephone Encounter (Signed)
Routing to a provider in our office to advise as PCP is out.   Patient is now out of medication because she has been taking 2 of the 12.5mg  tablets instead of 1.  According to PCP's last note,  Patient was to be taking 1 tablet daily.    Routing to see if it is okay to call in more medication for patient.   Appointment has been scheduled with PCP.

## 2018-03-07 NOTE — Telephone Encounter (Signed)
Please order as prescribed by cody; f/u scheduled. thanks

## 2018-03-07 NOTE — Telephone Encounter (Signed)
Pt called back and is taking 2/day of the 12.5mg  HCTZ. Appt scheduled for 03/17/18 for CPE with Malva Cogan, PA

## 2018-03-07 NOTE — Telephone Encounter (Signed)
12.5mg  tablets have been called in for patient.  She is aware to take 1 tablet daily until her appointment with [facility] and they can discuss this medication.   Patient stated verbal understanding

## 2018-03-07 NOTE — Telephone Encounter (Signed)
Copied from CRM [id number]. Topic: Quick Communication - Rx Refill/Question >> Mar 07, 2018  1:13 PM Maia Petties wrote: Medication: hctz - pt is out and there is a discrepency in the RX - pt thought she was to take 25mg  per day as she did before and has been taking 2/day of the 12.5mg  - she is asking which dose to take and then we'll need to contact the pharmacy because she is out Has the patient contacted their pharmacy? Yes  Preferred Pharmacy (with phone number or street name): [facility] [phone] Judithann Sheen, [place] - [address] [phone] (Phone) [phone] (Fax)

## 2018-03-07 NOTE — Addendum Note (Signed)
Addended by: Lenis Dickinson on: 03/07/2018 03:49 PM   Modules accepted: Orders

## 2018-03-17 ENCOUNTER — Other Ambulatory Visit: Payer: Self-pay

## 2018-03-17 ENCOUNTER — Encounter: Payer: Self-pay | Admitting: Physician Assistant

## 2018-03-17 ENCOUNTER — Ambulatory Visit (INDEPENDENT_AMBULATORY_CARE_PROVIDER_SITE_OTHER): Payer: Managed Care, Other (non HMO) | Admitting: Physician Assistant

## 2018-03-17 VITALS — BP 118/80 | HR 60 | Temp 97.9°F | Resp 14 | Ht 62.0 in | Wt 157.0 lb

## 2018-03-17 DIAGNOSIS — Z1239 Encounter for other screening for malignant neoplasm of breast: Secondary | ICD-10-CM | POA: Diagnosis not present

## 2018-03-17 DIAGNOSIS — Z1211 Encounter for screening for malignant neoplasm of colon: Secondary | ICD-10-CM | POA: Insufficient documentation

## 2018-03-17 DIAGNOSIS — Z0000 Encounter for general adult medical examination without abnormal findings: Secondary | ICD-10-CM | POA: Diagnosis not present

## 2018-03-17 DIAGNOSIS — I10 Essential (primary) hypertension: Secondary | ICD-10-CM

## 2018-03-17 LAB — LIPID PANEL
CHOL/HDL RATIO: 3
Cholesterol: 199 mg/dL (ref 0–200)
HDL: 57.7 mg/dL (ref >39.00)
LDL Cholesterol: 130 mg/dL — ABNORMAL HIGH (ref 0–99)
NONHDL: 141.39
Triglycerides: 57 mg/dL (ref 0.0–149.0)
VLDL: 11.4 mg/dL (ref 0.0–40.0)

## 2018-03-17 LAB — COMPREHENSIVE METABOLIC PANEL
ALBUMIN: 4.4 g/dL (ref 3.5–5.2)
ALT: 60 U/L — ABNORMAL HIGH (ref 0–35)
AST: 35 U/L (ref 0–37)
Alkaline Phosphatase: 74 U/L (ref 39–117)
BILIRUBIN TOTAL: 0.5 mg/dL (ref 0.2–1.2)
BUN: 17 mg/dL (ref 6–23)
CALCIUM: 9.9 mg/dL (ref 8.4–10.5)
CO2: 30 meq/L (ref 19–32)
CREATININE: 0.85 mg/dL (ref 0.40–1.20)
Chloride: 101 mEq/L (ref 96–112)
GFR: 74.63 mL/min (ref >60.00)
GLUCOSE: 96 mg/dL (ref 70–99)
Potassium: 3.9 mEq/L (ref 3.5–5.1)
Sodium: 139 mEq/L (ref 135–145)
Total Protein: 7.3 g/dL (ref 6.0–8.3)

## 2018-03-17 LAB — CBC WITH DIFFERENTIAL/PLATELET
BASOS PCT: 0.8 % (ref 0.0–3.0)
Basophils Absolute: 0 10*3/uL (ref 0.0–0.1)
EOS PCT: 3.2 % (ref 0.0–5.0)
Eosinophils Absolute: 0.2 10*3/uL (ref 0.0–0.7)
HEMATOCRIT: 41.5 % (ref 36.0–46.0)
HEMOGLOBIN: 14.1 g/dL (ref 12.0–15.0)
LYMPHS PCT: 36 % (ref 12.0–46.0)
Lymphs Abs: 1.9 10*3/uL (ref 0.7–4.0)
MCHC: 33.9 g/dL (ref 30.0–36.0)
MCV: 92.4 fl (ref 78.0–100.0)
MONO ABS: 0.4 10*3/uL (ref 0.1–1.0)
MONOS PCT: 7.7 % (ref 3.0–12.0)
Neutro Abs: 2.8 10*3/uL (ref 1.4–7.7)
Neutrophils Relative %: 52.3 % (ref 43.0–77.0)
Platelets: 372 10*3/uL (ref 150.0–400.0)
RBC: 4.5 Mil/uL (ref 3.87–5.11)
RDW: 11.9 % (ref 11.5–15.5)
WBC: 5.3 10*3/uL (ref 4.0–10.5)

## 2018-03-17 LAB — TSH: TSH: 0.84 u[IU]/mL (ref 0.35–4.50)

## 2018-03-17 LAB — HEMOGLOBIN A1C: Hgb A1c MFr Bld: 5.6 % (ref 4.6–6.5)

## 2018-03-17 MED ORDER — SUVOREXANT 10 MG PO TABS: 10.0000 mg | ORAL_TABLET | Freq: Every day | ORAL | 0 refills | Status: DC

## 2018-03-17 NOTE — Patient Instructions (Signed)
Please go to the lab for blood work.   Our office will call you with your results unless you have chosen to receive results via MyChart.  If your blood work is normal we will follow-up each year for physicals and as scheduled for chronic medical problems.  If anything is abnormal we will treat accordingly and get you in for a follow-up.  You will be contacted for your mammogram and to see GI regarding the Colonoscopy.   Preventive Care 40-64 Years, Female Preventive care refers to lifestyle choices and visits with your health care provider that can promote health and wellness. What does preventive care include?  A yearly physical exam. This is also called an annual well check.  Dental exams once or twice a year.  Routine eye exams. Ask your health care provider how often you should have your eyes checked.  Personal lifestyle choices, including: ? Daily care of your teeth and gums. ? Regular physical activity. ? Eating a healthy diet. ? Avoiding tobacco and drug use. ? Limiting alcohol use. ? Practicing safe sex. ? Taking low-dose aspirin daily starting at age 11. ? Taking vitamin and mineral supplements as recommended by your health care provider. What happens during an annual well check? The services and screenings done by your health care provider during your annual well check will depend on your age, overall health, lifestyle risk factors, and family history of disease. Counseling Your health care provider may ask you questions about your:  Alcohol use.  Tobacco use.  Drug use.  Emotional well-being.  Home and relationship well-being.  Sexual activity.  Eating habits.  Work and work Statistician.  Method of birth control.  Menstrual cycle.  Pregnancy history.  Screening You may have the following tests or measurements:  Height, weight, and BMI.  Blood pressure.  Lipid and cholesterol levels. These may be checked every 5 years, or more frequently if  you are over 20 years old.  Skin check.  Lung cancer screening. You may have this screening every year starting at age 15 if you have a 30-pack-year history of smoking and currently smoke or have quit within the past 15 years.  Fecal occult blood test (FOBT) of the stool. You may have this test every year starting at age 78.  Flexible sigmoidoscopy or colonoscopy. You may have a sigmoidoscopy every 5 years or a colonoscopy every 10 years starting at age 71.  Hepatitis C blood test.  Hepatitis B blood test.  Sexually transmitted disease (STD) testing.  Diabetes screening. This is done by checking your blood sugar (glucose) after you have not eaten for a while (fasting). You may have this done every 1-3 years.  Mammogram. This may be done every 1-2 years. Talk to your health care provider about when you should start having regular mammograms. This may depend on whether you have a family history of breast cancer.  BRCA-related cancer screening. This may be done if you have a family history of breast, ovarian, tubal, or peritoneal cancers.  Pelvic exam and Pap test. This may be done every 3 years starting at age 52. Starting at age 40, this may be done every 5 years if you have a Pap test in combination with an HPV test.  Bone density scan. This is done to screen for osteoporosis. You may have this scan if you are at high risk for osteoporosis.  Discuss your test results, treatment options, and if necessary, the need for more tests with your health care provider.  Vaccines Your health care provider may recommend certain vaccines, such as:  Influenza vaccine. This is recommended every year.  Tetanus, diphtheria, and acellular pertussis (Tdap, Td) vaccine. You may need a Td booster every 10 years.  Varicella vaccine. You may need this if you have not been vaccinated.  Zoster vaccine. You may need this after age 62.  Measles, mumps, and rubella (MMR) vaccine. You may need at least one  dose of MMR if you were born in 1957 or later. You may also need a second dose.  Pneumococcal 13-valent conjugate (PCV13) vaccine. You may need this if you have certain conditions and were not previously vaccinated.  Pneumococcal polysaccharide (PPSV23) vaccine. You may need one or two doses if you smoke cigarettes or if you have certain conditions.  Meningococcal vaccine. You may need this if you have certain conditions.  Hepatitis A vaccine. You may need this if you have certain conditions or if you travel or work in places where you may be exposed to hepatitis A.  Hepatitis B vaccine. You may need this if you have certain conditions or if you travel or work in places where you may be exposed to hepatitis B.  Haemophilus influenzae type b (Hib) vaccine. You may need this if you have certain conditions.  Talk to your health care provider about which screenings and vaccines you need and how often you need them. This information is not intended to replace advice given to you by your health care provider. Make sure you discuss any questions you have with your health care provider. Document Released: 05/30/2015 Document Revised: 01/21/2016 Document Reviewed: 03/04/2015 Elsevier Interactive Patient Education  [employer].

## 2018-03-17 NOTE — Assessment & Plan Note (Signed)
BP normotensive. Asymptomatic. Will repeat labs today. Continue current regimen. Follow-up Q 6 months.

## 2018-03-17 NOTE — Assessment & Plan Note (Signed)
Depression screen negative. Health Maintenance reviewed. Preventive schedule discussed and handout given in AVS. Will obtain fasting labs today.  

## 2018-03-17 NOTE — Assessment & Plan Note (Signed)
Order for screening mammogram placed today for asymptomatic patient.

## 2018-03-17 NOTE — Assessment & Plan Note (Signed)
Overdue. Asymptomatic. Average risk. Referral placed to GI for screening colonoscopy after discussion of options with patient.

## 2018-03-17 NOTE — Progress Notes (Signed)
Patient presents to clinic today for annual exam.  Patient is fasting for labs.  Diet -- Very good, well-balanced diet.  Exercise -- exercising 6 x week.  Body mass index is 28.72 kg/m.  Acute Concerns: Denies acute concerns today.  Chronic Issues: Hypertension -- Is currently on a regimen of Lisinopril 20 mg QD and HCTZ 12.5 mg QD. Endorses taking medications as directed and tolerating well. Patient denies chest pain, palpitations, lightheadedness, dizziness, vision changes or frequent headaches.  BP Readings from Last 3 Encounters:  03/17/18 118/80  01/09/18 114/78  10/27/17 123/75   Health Maintenance: Immunizations -- Flu and Tetanus are up-to-date. Colon Cancer Screening -- Due. Asymptomatic. Average risk. Agrees to . Mammogram --  Due. Will place order for screening mammogram. PAP -- s/p hysterectomy  Past Medical History:  Diagnosis Date  . Cervical disc disease   . Elevated blood pressure (not hypertension)   . Hypertension   . Impingement syndrome of right shoulder     Past Surgical History:  Procedure Laterality Date  . NECK SURGERY  07/23/2014   Cervical Disk  . SHOULDER SURGERY  2013   Right, Bursa  . TONSILLECTOMY  age 22  . TOTAL SHOULDER ARTHROPLASTY    . VAGINAL HYSTERECTOMY  03-09-2000   W/ LEFT SALPINGOOPHECTOMY    Current Outpatient Medications on File Prior to Visit  Medication Sig Dispense Refill  . Cholecalciferol (VITAMIN D3) 5000 units CAPS Take 5,000 Units by mouth daily.    Marland Kitchen estradiol (VIVELLE-DOT) 0.1 MG/24HR patch Place 1 patch onto the skin once a week.    . Ginger 500 MG CAPS Take 1 capsule by mouth daily.    Marland Kitchen HM OMEGA-3-6-9 FATTY ACIDS CAPS Take 2 each by mouth daily. Omega 3-5-6-7-9    . hydrochlorothiazide (HYDRODIURIL) 12.5 MG tablet Take 1 tablet (12.5 mg total) by mouth daily. 90 tablet 1  . lisinopril (PRINIVIL,ZESTRIL) 20 MG tablet TAKE 1 TABLET BY MOUTH EVERY DAY (Patient taking differently: Take 20 mg by mouth daily. )  90 tablet 1  . Nutritional Supplements (JUICE PLUS FIBRE PO) Take 2 capsules by mouth daily. 2 Berries, 2 Vegs, 2 Fruits    . Probiotic Product (ALIGN) 4 MG CAPS Take 1 capsule by mouth daily.    . Turmeric 500 MG CAPS Take 1 capsule by mouth daily.     No current facility-administered medications on file prior to visit.     No Known Allergies  Family History  Adopted: Yes  Problem Relation Age of Onset  . Heart disease Father        Living  . Fibromyalgia Mother   . Hypertension Mother   . Allergies Brother   . Autoimmune disease Brother   . Healthy Son        x2  . Polycystic ovary syndrome Daughter        x1  . Autoimmune disease Brother   . Autoimmune disease Brother     Social History   Socioeconomic History  . Marital status: Divorced    Spouse name: Not on file  . Number of children: Not on file  . Years of education: Not on file  . 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 Financial resource strain: Not on file  . Food insecurity:    Worry: Not on file    Inability: Not on file  . Transportation needs:    Medical:  Not on file    Non-medical: Not on file  Tobacco Use  . Smoking status: Never Smoker  . Smokeless tobacco: Never Used  Substance and Sexual Activity  . Alcohol use: Yes    Alcohol/week: 0.0 standard drinks    Comment: rare  . Drug use: No  . Sexual activity: Yes    Birth control/protection: Surgical  Lifestyle  . Physical activity:    Days per week: Not on file    Minutes per session: Not on file  . Stress: Not on file  Relationships  . Social connections:    Talks on phone: Not on file    Gets together: Not on file    Attends religious service: Not on file    Active member of club or organization: Not on file    Attends meetings of clubs or organizations: Not on file    Relationship status: Not on file  . Intimate partner violence:    Fear of current or  ex partner: Not on file    Emotionally abused: Not on file    Physically abused: Not on file    Forced sexual activity: Not on file  Other Topics Concern  . Not on file  Social History Narrative  . Not on file   Review of Systems  Constitutional: Negative for fever and weight loss.  HENT: Negative for ear discharge, ear pain, hearing loss and tinnitus.   Eyes: Negative for blurred vision, double vision, photophobia and pain.  Respiratory: Negative for cough and shortness of breath.   Cardiovascular: Negative for chest pain and palpitations.  Gastrointestinal: Negative for abdominal pain, blood in stool, constipation, diarrhea, heartburn, melena, nausea and vomiting.  Genitourinary: Negative for dysuria, flank pain, frequency, hematuria and urgency.  Musculoskeletal: Negative for falls.  Neurological: Negative for dizziness, loss of consciousness and headaches.  Endo/Heme/Allergies: Negative for environmental allergies.  Psychiatric/Behavioral: Negative for depression, hallucinations, substance abuse and suicidal ideas. The patient is not nervous/anxious and does not have insomnia.    BP 118/80   Pulse 60   Temp 97.9 F (36.6 C) (Oral)   Resp 14   Ht 5\' 2"  (1.575 m)   Wt 157 lb (71.2 kg)   SpO2 98%   BMI 28.72 kg/m   Physical Exam  Constitutional: She is oriented to person, place, and time.  HENT:  Head: Normocephalic and atraumatic.  Right Ear: Tympanic membrane, external ear and ear canal normal.  Left Ear: Tympanic membrane, external ear and ear canal normal.  Nose: Nose normal. No mucosal edema.  Mouth/Throat: Uvula is midline, oropharynx is clear and moist and mucous membranes are normal. No oropharyngeal exudate or posterior oropharyngeal erythema.  Eyes: Pupils are equal, round, and reactive to light. Conjunctivae are normal.  Neck: Neck supple. No thyromegaly present.  Cardiovascular: Normal rate, regular rhythm, normal heart sounds and intact distal pulses.    Pulmonary/Chest: Effort normal and breath sounds normal. No respiratory distress. She has no wheezes. She has no rales.  Abdominal: Soft. Bowel sounds are normal. She exhibits no distension and no mass. There is no tenderness. There is no rebound and no guarding.  Lymphadenopathy:    She has no cervical adenopathy.  Neurological: She is alert and oriented to person, place, and time. No cranial nerve deficit.  Skin: Skin is warm and dry. No rash noted.  Vitals reviewed.  Assessment/Plan: Essential hypertension BP normotensive. Asymptomatic. Will repeat labs today. Continue current regimen. Follow-up Q 6 months.   Screening for breast cancer  Order for screening mammogram placed today for asymptomatic patient.   Colon cancer screening Overdue. Asymptomatic. Average risk. Referral placed to GI for screening colonoscopy after discussion of options with patient.   Visit for preventive health examination Depression screen negative. Health Maintenance reviewed. Preventive schedule discussed and handout given in AVS. Will obtain fasting labs today.     Piedad Climes, PA-C

## 2018-03-20 ENCOUNTER — Encounter: Payer: Self-pay | Admitting: Physician Assistant

## 2018-04-21 ENCOUNTER — Encounter: Payer: Self-pay | Admitting: Physician Assistant

## 2018-04-28 ENCOUNTER — Ambulatory Visit: Payer: Managed Care, Other (non HMO)

## 2018-05-24 ENCOUNTER — Ambulatory Visit
Admission: RE | Admit: 2018-05-24 | Discharge: 2018-05-24 | Disposition: A | Discharge status: Home / Self Care | Payer: Managed Care, Other (non HMO) | Source: Ambulatory Visit | Attending: Physician Assistant | Admitting: Physician Assistant

## 2018-05-24 DIAGNOSIS — Z1239 Encounter for other screening for malignant neoplasm of breast: Secondary | ICD-10-CM

## 2018-05-25 ENCOUNTER — Other Ambulatory Visit: Payer: Self-pay | Admitting: Physician Assistant

## 2018-05-25 DIAGNOSIS — R928 Other abnormal and inconclusive findings on diagnostic imaging of breast: Secondary | ICD-10-CM

## 2018-05-31 ENCOUNTER — Ambulatory Visit
Admission: RE | Admit: 2018-05-31 | Discharge: 2018-05-31 | Disposition: A | Discharge status: Home / Self Care | Payer: Managed Care, Other (non HMO) | Source: Ambulatory Visit | Attending: Physician Assistant | Admitting: Physician Assistant

## 2018-05-31 DIAGNOSIS — R928 Other abnormal and inconclusive findings on diagnostic imaging of breast: Secondary | ICD-10-CM

## 2018-09-07 ENCOUNTER — Other Ambulatory Visit: Payer: Self-pay | Admitting: Physician Assistant

## 2018-09-07 DIAGNOSIS — I10 Essential (primary) hypertension: Secondary | ICD-10-CM

## 2018-09-12 ENCOUNTER — Other Ambulatory Visit: Payer: Self-pay | Admitting: Physician Assistant

## 2018-09-12 DIAGNOSIS — I10 Essential (primary) hypertension: Secondary | ICD-10-CM

## 2018-12-10 ENCOUNTER — Other Ambulatory Visit: Payer: Self-pay | Admitting: Physician Assistant

## 2018-12-10 DIAGNOSIS — I10 Essential (primary) hypertension: Secondary | ICD-10-CM

## 2018-12-11 ENCOUNTER — Encounter: Payer: Self-pay | Admitting: Emergency Medicine

## 2019-03-21 ENCOUNTER — Other Ambulatory Visit: Payer: Self-pay | Admitting: Physician Assistant

## 2019-03-21 DIAGNOSIS — I10 Essential (primary) hypertension: Secondary | ICD-10-CM

## 2019-04-02 ENCOUNTER — Other Ambulatory Visit: Payer: Self-pay | Admitting: Physician Assistant

## 2019-04-02 DIAGNOSIS — I10 Essential (primary) hypertension: Secondary | ICD-10-CM

## 2019-07-01 ENCOUNTER — Other Ambulatory Visit: Payer: Self-pay | Admitting: Physician Assistant

## 2019-07-01 DIAGNOSIS — I10 Essential (primary) hypertension: Secondary | ICD-10-CM

## 2019-09-28 ENCOUNTER — Other Ambulatory Visit: Payer: Self-pay | Admitting: Physician Assistant

## 2019-09-28 DIAGNOSIS — I10 Essential (primary) hypertension: Secondary | ICD-10-CM

## 2019-10-24 ENCOUNTER — Other Ambulatory Visit: Payer: Self-pay | Admitting: Physician Assistant

## 2019-10-24 DIAGNOSIS — I10 Essential (primary) hypertension: Secondary | ICD-10-CM

## 2019-10-24 NOTE — Telephone Encounter (Signed)
No refills if they have not been seen within a year. She will need appt.

## 2019-10-24 NOTE — Telephone Encounter (Signed)
Karen Gregory this patient hasn't been seen since 2019 can a refill be sent in for her?

## 2019-10-26 ENCOUNTER — Ambulatory Visit (INDEPENDENT_AMBULATORY_CARE_PROVIDER_SITE_OTHER): Payer: Managed Care, Other (non HMO) | Admitting: Physician Assistant

## 2019-10-26 ENCOUNTER — Other Ambulatory Visit: Payer: Self-pay

## 2019-10-26 ENCOUNTER — Encounter: Payer: Self-pay | Admitting: Physician Assistant

## 2019-10-26 VITALS — BP 118/82 | HR 64 | Temp 97.7°F | Ht 62.0 in | Wt 176.2 lb

## 2019-10-26 DIAGNOSIS — F329 Major depressive disorder, single episode, unspecified: Secondary | ICD-10-CM

## 2019-10-26 DIAGNOSIS — Z0000 Encounter for general adult medical examination without abnormal findings: Secondary | ICD-10-CM | POA: Diagnosis not present

## 2019-10-26 DIAGNOSIS — Z1211 Encounter for screening for malignant neoplasm of colon: Secondary | ICD-10-CM | POA: Diagnosis not present

## 2019-10-26 DIAGNOSIS — Z1231 Encounter for screening mammogram for malignant neoplasm of breast: Secondary | ICD-10-CM | POA: Diagnosis not present

## 2019-10-26 DIAGNOSIS — I10 Essential (primary) hypertension: Secondary | ICD-10-CM

## 2019-10-26 MED ORDER — LISINOPRIL 40 MG PO TABS: 20.0000 mg | ORAL_TABLET | Freq: Every day | ORAL | 1 refills | Status: DC

## 2019-10-26 MED ORDER — BUPROPION HCL ER (XL) 150 MG PO TB24
150.0000 mg | ORAL_TABLET | Freq: Every day | ORAL | 3 refills | Status: DC
Start: 2019-10-26 — End: 2020-02-25

## 2019-10-26 MED ORDER — HYDROCHLOROTHIAZIDE 12.5 MG PO TABS: 12.5000 mg | ORAL_TABLET | Freq: Every day | ORAL | 1 refills | Status: DC

## 2019-10-26 NOTE — Patient Instructions (Signed)
Please go to the lab for blood work.   Our office will call you with your results unless you have chosen to receive results via MyChart.  If your blood work is normal we will follow-up each year for physicals and as scheduled for chronic medical problems.  If anything is abnormal we will treat accordingly and get you in for a follow-up.  Continue your current BP medication regimen.  Keep up with a low-salt diet.  Try to go for a walk when able.   Use the handouts given to schedule counseling appointments.  Start the Wellbutrin XL taking one tablet daily as directed. We will follow-up in 4-6 weeks via video visit.  Sooner if needed.  For tension headaches, ok to start a heating pad to the area 10-15 minutes, a few times per day.  If symptoms are not easing up, would like to have you see PT.  Hang in there!   Preventive Care 49-57 Years Old, Female Preventive care refers to visits with your health care provider and lifestyle choices that can promote health and wellness. This includes:  A yearly physical exam. This may also be called an annual well check.  Regular dental visits and eye exams.  Immunizations.  Screening for certain conditions.  Healthy lifestyle choices, such as eating a healthy diet, getting regular exercise, not using drugs or products that contain nicotine and tobacco, and limiting alcohol use. What can I expect for my preventive care visit? Physical exam Your health care provider will check your:  Height and weight. This may be used to calculate body mass index (BMI), which tells if you are at a healthy weight.  Heart rate and blood pressure.  Skin for abnormal spots. Counseling Your health care provider may ask you questions about your:  Alcohol, tobacco, and drug use.  Emotional well-being.  Home and relationship well-being.  Sexual activity.  Eating habits.  Work and work Statistician.  Method of birth control.  Menstrual  cycle.  Pregnancy history. What immunizations do I need?  Influenza (flu) vaccine  This is recommended every year. Tetanus, diphtheria, and pertussis (Tdap) vaccine  You may need a Td booster every 10 years. Varicella (chickenpox) vaccine  You may need this if you have not been vaccinated. Zoster (shingles) vaccine  You may need this after age 23. Measles, mumps, and rubella (MMR) vaccine  You may need at least one dose of MMR if you were born in 1957 or later. You may also need a second dose. Pneumococcal conjugate (PCV13) vaccine  You may need this if you have certain conditions and were not previously vaccinated. Pneumococcal polysaccharide (PPSV23) vaccine  You may need one or two doses if you smoke cigarettes or if you have certain conditions. Meningococcal conjugate (MenACWY) vaccine  You may need this if you have certain conditions. Hepatitis A vaccine  You may need this if you have certain conditions or if you travel or work in places where you may be exposed to hepatitis A. Hepatitis B vaccine  You may need this if you have certain conditions or if you travel or work in places where you may be exposed to hepatitis B. Haemophilus influenzae type b (Hib) vaccine  You may need this if you have certain conditions. Human papillomavirus (HPV) vaccine  If recommended by your health care provider, you may need three doses over 6 months. You may receive vaccines as individual doses or as more than one vaccine together in one shot (combination vaccines). Talk with  your health care provider about the risks and benefits of combination vaccines. What tests do I need? Blood tests  Lipid and cholesterol levels. These may be checked every 5 years, or more frequently if you are over 50 years old.  Hepatitis C test.  Hepatitis B test. Screening  Lung cancer screening. You may have this screening every year starting at age 55 if you have a 30-pack-year history of smoking and  currently smoke or have quit within the past 15 years.  Colorectal cancer screening. All adults should have this screening starting at age 50 and continuing until age 75. Your health care provider may recommend screening at age 45 if you are at increased risk. You will have tests every 1-10 years, depending on your results and the type of screening test.  Diabetes screening. This is done by checking your blood sugar (glucose) after you have not eaten for a while (fasting). You may have this done every 1-3 years.  Mammogram. This may be done every 1-2 years. Talk with your health care provider about when you should start having regular mammograms. This may depend on whether you have a family history of breast cancer.  BRCA-related cancer screening. This may be done if you have a family history of breast, ovarian, tubal, or peritoneal cancers.  Pelvic exam and Pap test. This may be done every 3 years starting at age 21. Starting at age 30, this may be done every 5 years if you have a Pap test in combination with an HPV test. Other tests  Sexually transmitted disease (STD) testing.  Bone density scan. This is done to screen for osteoporosis. You may have this scan if you are at high risk for osteoporosis. Follow these instructions at home: Eating and drinking  Eat a diet that includes fresh fruits and vegetables, whole grains, lean protein, and low-fat dairy.  Take vitamin and mineral supplements as recommended by your health care provider.  Do not drink alcohol if: ? Your health care provider tells you not to drink. ? You are pregnant, may be pregnant, or are planning to become pregnant.  If you drink alcohol: ? Limit how much you have to 0-1 drink a day. ? Be aware of how much alcohol is in your drink. In the [place] one drink equals one 12 oz bottle of beer (355 mL), one 5 oz glass of wine (148 mL), or one 1 oz glass of hard liquor (44 mL). Lifestyle  Take daily care of your teeth and  gums.  Stay active. Exercise for at least 30 minutes on 5 or more days each week.  Do not use any products that contain nicotine or tobacco, such as cigarettes, e-cigarettes, and chewing tobacco. If you need help quitting, ask your health care provider.  If you are sexually active, practice safe sex. Use a condom or other form of birth control (contraception) in order to prevent pregnancy and STIs (sexually transmitted infections).  If told by your health care provider, take low-dose aspirin daily starting at age 50. What's next?  Visit your health care provider once a year for a well check visit.  Ask your health care provider how often you should have your eyes and teeth checked.  Stay up to date on all vaccines. This information is not intended to replace advice given to you by your health care provider. Make sure you discuss any questions you have with your health care provider. Document Revised: 01/12/2018 Document Reviewed: 01/12/2018 Elsevier Patient Education    2020 [employer] .      

## 2019-10-26 NOTE — Progress Notes (Signed)
Patient presents to clinic today for annual exam.  Patient is fasting for labs.  Acute Concerns: Patient notes depressed mood and anhedonia with increased stress and irritability over the past 15 months but worsening in the past few months. Notes lack of motivation, decrease in exercise and increase in poor eating and sleep. Work has been extremely stressful for her and all of these stressors along with weight gain, etc have made her feel quite down. Denies anxiety or panic attack. Denies SI/HI. Has remote history of depression. Is agreeable to medication and counseling.   Chronic Issues: Hypertension -- Patient on a regimen of lisinopril 40 mg QD and HCTZ 12.5 mg QD. Endorses taking medication as directed. Patient denies chest pain, palpitations, lightheadedness, dizziness, vision changes or frequent headaches.  BP Readings from Last 3 Encounters:  10/26/19 118/82  03/17/18 118/80  01/09/18 114/78   Health Maintenance: Immunizations -- UTD Colonoscopy -- Due. Agrees to cologuard. Order placed.  Mammogram -- Due. Denies concerns today. PAP -- s/p hysterectomy  Past Medical History:  Diagnosis Date  . Cervical disc disease   . Elevated blood pressure (not hypertension)   . Hypertension   . Impingement syndrome of right shoulder     Past Surgical History:  Procedure Laterality Date  . NECK SURGERY  07/23/2014   Cervical Disk  . SHOULDER SURGERY  2013   Right, Bursa  . TONSILLECTOMY  age 53  . TOTAL SHOULDER ARTHROPLASTY    . VAGINAL HYSTERECTOMY  03-09-2000   W/ LEFT SALPINGOOPHECTOMY    Current Outpatient Medications on File Prior to Visit  Medication Sig Dispense Refill  . Cholecalciferol (VITAMIN D3) 5000 units CAPS Take 5,000 Units by mouth daily.    Marland Kitchen estradiol (VIVELLE-DOT) 0.1 MG/24HR patch Place 1 patch onto the skin once a week.    . Ginger 500 MG CAPS Take 1 capsule by mouth daily.    Marland Kitchen HM OMEGA-3-6-9 FATTY ACIDS CAPS Take 2 each by mouth daily. Omega  3-5-6-7-9    . Nutritional Supplements (JUICE PLUS FIBRE PO) Take 2 capsules by mouth daily. 2 Berries, 2 Vegs, 2 Fruits    . Probiotic Product (ALIGN) 4 MG CAPS Take 1 capsule by mouth daily.    . Turmeric 500 MG CAPS Take 1 capsule by mouth daily.     No current facility-administered medications on file prior to visit.    No Known Allergies  Family History  Adopted: Yes  Problem Relation Age of Onset  . Heart disease Father        Living  . Fibromyalgia Mother   . Hypertension Mother   . Allergies Brother   . Autoimmune disease Brother   . Healthy Son        x2  . Polycystic ovary syndrome Daughter        x1  . Autoimmune disease Brother   . Autoimmune disease Brother   . Breast cancer Neg Hx     Social History   Socioeconomic History  . Marital status: Divorced    Spouse name: Not on file  . Number of children: Not on file  . Years of education: Not on file  . 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 Smoking status: Never Smoker  . Smokeless tobacco: Never Used  Vaping Use  . Vaping Use: Never used  Substance and Sexual Activity  . Alcohol use: Yes  Alcohol/week: 0.0 standard drinks    Comment: rare  . Drug use: No  . Sexual activity: Yes    Birth control/protection: Surgical  Other Topics Concern  . Not on file  Social History Narrative  . Not on file   Social Determinants of Health   Financial Resource Strain:   . Difficulty of Paying Living Expenses:   Food Insecurity:   . Worried About Programme researcher, broadcasting/film/video in the Last Year:   . Barista in the Last Year:   Transportation Needs:   . Freight forwarder (Medical):   Marland Kitchen Lack of Transportation (Non-Medical):   Physical Activity:   . Days of Exercise per Week:   . Minutes of Exercise per Session:   Stress:   . Feeling of Stress :   Social Connections:   . Frequency of Communication with Friends and Family:     . Frequency of Social Gatherings with Friends and Family:   . Attends Religious Services:   . Active Member of Clubs or Organizations:   . Attends Banker Meetings:   Marland Kitchen Marital Status:   Intimate Partner Violence:   . Fear of Current or Ex-Partner:   . Emotionally Abused:   Marland Kitchen Physically Abused:   . Sexually Abused:    Review of Systems  Constitutional: Negative for fever and weight loss.  HENT: Negative for ear discharge, ear pain, hearing loss and tinnitus.   Eyes: Negative for blurred vision, double vision, photophobia and pain.  Respiratory: Negative for cough and shortness of breath.   Cardiovascular: Negative for chest pain and palpitations.  Gastrointestinal: Negative for abdominal pain, blood in stool, constipation, diarrhea, heartburn, melena, nausea and vomiting.  Genitourinary: Negative for dysuria, flank pain, frequency, hematuria and urgency.  Musculoskeletal: Negative for falls.  Neurological: Negative for dizziness, loss of consciousness and headaches.  Endo/Heme/Allergies: Negative for environmental allergies.  Psychiatric/Behavioral: Positive for depression. Negative for hallucinations, substance abuse and suicidal ideas. The patient is not nervous/anxious and does not have insomnia.    BP 118/82   Pulse 64   Temp 97.7 F (36.5 C)   Ht 5\' 2"  (1.575 m)   Wt 176 lb 3.2 oz (79.9 kg)   SpO2 98%   BMI 32.23 kg/m   Physical Exam Vitals reviewed.  HENT:     Head: Normocephalic and atraumatic.     Right Ear: Tympanic membrane, ear canal and external ear normal.     Left Ear: Tympanic membrane, ear canal and external ear normal.     Nose: Nose normal. No mucosal edema.     Mouth/Throat:     Pharynx: Uvula midline. No oropharyngeal exudate or posterior oropharyngeal erythema.  Eyes:     Conjunctiva/sclera: Conjunctivae normal.     Pupils: Pupils are equal, round, and reactive to light.  Neck:     Thyroid: No thyromegaly.  Cardiovascular:     Rate  and Rhythm: Normal rate and regular rhythm.     Heart sounds: Normal heart sounds.  Pulmonary:     Effort: Pulmonary effort is normal. No respiratory distress.     Breath sounds: Normal breath sounds. No wheezing or rales.  Abdominal:     General: Bowel sounds are normal. There is no distension.     Palpations: Abdomen is soft. There is no mass.     Tenderness: There is no abdominal tenderness. There is no guarding or rebound.  Musculoskeletal:     Cervical back: Neck supple.  Lymphadenopathy:  Cervical: No cervical adenopathy.  Skin:    General: Skin is warm and dry.     Findings: No rash.  Neurological:     Mental Status: She is alert and oriented to person, place, and time.     Cranial Nerves: No cranial nerve deficit.    Assessment/Plan: 1. Visit for preventive health examination Depression screen negative. Health Maintenance reviewed. Preventive schedule discussed and handout given in AVS. Will obtain fasting labs today.  - CBC with Differential/Platelet - Comprehensive metabolic panel - Lipid panel - TSH - Vitamin D (25 hydroxy)  2. Essential hypertension BP normotensive. Asymptomatic. Continue current regimen. Fasting labs today. Meds refilled. - Comprehensive metabolic panel - Lipid panel - hydrochlorothiazide (HYDRODIURIL) 12.5 MG tablet; Take 1 tablet (12.5 mg total) by mouth daily.  Dispense: 90 tablet; Refill: 1 - lisinopril (ZESTRIL) 40 MG tablet; Take 0.5 tablets (20 mg total) by mouth daily.  Dispense: 90 tablet; Refill: 1  3. Encounter for screening mammogram for malignant neoplasm of breast Order for screening mammogram placed.  4. Colon cancer screening Cologuard order placed.   5. Reactive depression Handouts on counseling services given to patient with recommendations. Will start Wellbutrin XL 150 mg daily. Close follow-up scheduled.   This visit occurred during the SARS-CoV-2 public health emergency.  Safety protocols were in place, including  screening questions prior to the visit, additional usage of staff PPE, and extensive cleaning of exam room while observing appropriate contact time as indicated for disinfecting solutions.     Leeanne Rio, PA-C

## 2019-10-27 LAB — COMPREHENSIVE METABOLIC PANEL
AG Ratio: 1.7 (calc) (ref 1.0–2.5)
ALT: 66 U/L — ABNORMAL HIGH (ref 6–29)
AST: 40 U/L — ABNORMAL HIGH (ref 10–35)
Albumin: 4.4 g/dL (ref 3.6–5.1)
Alkaline phosphatase (APISO): 76 U/L (ref 37–153)
BUN/Creatinine Ratio: 25 (calc) — ABNORMAL HIGH (ref 6–22)
BUN: 27 mg/dL — ABNORMAL HIGH (ref 7–25)
CO2: 26 mmol/L (ref 20–32)
Calcium: 9.9 mg/dL (ref 8.6–10.4)
Chloride: 100 mmol/L (ref 98–110)
Creat: 1.08 mg/dL — ABNORMAL HIGH (ref 0.50–1.05)
Globulin: 2.6 g/dL (calc) (ref 1.9–3.7)
Glucose, Bld: 98 mg/dL (ref 65–99)
Potassium: 4.2 mmol/L (ref 3.5–5.3)
Sodium: 137 mmol/L (ref 135–146)
Total Bilirubin: 0.4 mg/dL (ref 0.2–1.2)
Total Protein: 7 g/dL (ref 6.1–8.1)

## 2019-10-27 LAB — CBC WITH DIFFERENTIAL/PLATELET
Absolute Monocytes: 539 cells/uL (ref 200–950)
Basophils Absolute: 49 cells/uL (ref 0–200)
Basophils Relative: 0.7 %
Eosinophils Absolute: 210 cells/uL (ref 15–500)
Eosinophils Relative: 3 %
HCT: 42 % (ref 35.0–45.0)
Hemoglobin: 14.3 g/dL (ref 11.7–15.5)
Lymphs Abs: 2023 cells/uL (ref 850–3900)
MCH: 31.2 pg (ref 27.0–33.0)
MCHC: 34 g/dL (ref 32.0–36.0)
MCV: 91.7 fL (ref 80.0–100.0)
MPV: 10 fL (ref 7.5–12.5)
Monocytes Relative: 7.7 %
Neutro Abs: 4179 cells/uL (ref 1500–7800)
Neutrophils Relative %: 59.7 %
Platelets: 366 10*3/uL (ref 140–400)
RBC: 4.58 10*6/uL (ref 3.80–5.10)
RDW: 11.3 % (ref 11.0–15.0)
Total Lymphocyte: 28.9 %
WBC: 7 10*3/uL (ref 3.8–10.8)

## 2019-10-27 LAB — LIPID PANEL
Cholesterol: 218 mg/dL — ABNORMAL HIGH (ref <200)
HDL: 61 mg/dL (ref >=50)
LDL Cholesterol (Calc): 137 mg/dL (calc) — ABNORMAL HIGH
Non-HDL Cholesterol (Calc): 157 mg/dL (calc) — ABNORMAL HIGH (ref <130)
Total CHOL/HDL Ratio: 3.6 (calc) (ref <5.0)
Triglycerides: 102 mg/dL (ref <150)

## 2019-10-27 LAB — VITAMIN D 25 HYDROXY (VIT D DEFICIENCY, FRACTURES): Vit D, 25-Hydroxy: 60 ng/mL (ref 30–100)

## 2019-10-27 LAB — TSH: TSH: 0.7 mIU/L

## 2019-11-17 ENCOUNTER — Other Ambulatory Visit: Payer: Self-pay | Admitting: Physician Assistant

## 2019-11-20 ENCOUNTER — Encounter: Payer: Self-pay | Admitting: Emergency Medicine

## 2020-02-23 ENCOUNTER — Other Ambulatory Visit: Payer: Self-pay | Admitting: Physician Assistant

## 2020-03-25 ENCOUNTER — Other Ambulatory Visit: Payer: Self-pay | Admitting: Physician Assistant

## 2020-03-26 ENCOUNTER — Other Ambulatory Visit: Payer: Managed Care, Other (non HMO)

## 2020-03-26 DIAGNOSIS — Z20822 Contact with and (suspected) exposure to covid-19: Secondary | ICD-10-CM

## 2020-03-27 LAB — SARS-COV-2, NAA 2 DAY TAT

## 2020-03-27 LAB — NOVEL CORONAVIRUS, NAA: SARS-CoV-2, NAA: NOT DETECTED

## 2020-03-28 ENCOUNTER — Encounter: Payer: Self-pay | Admitting: Physician Assistant

## 2020-04-07 ENCOUNTER — Telehealth: Payer: Self-pay | Admitting: Physician Assistant

## 2020-04-07 ENCOUNTER — Other Ambulatory Visit: Payer: Self-pay | Admitting: Physician Assistant

## 2020-04-07 NOTE — Telephone Encounter (Signed)
Visit scheduled for 04/08/2020

## 2020-04-07 NOTE — Telephone Encounter (Signed)
..  Medication Refills  Medication:  Welbutrin  Pharmacy:  [facility] - [address] [place], [place]  ** Let patient know to contact pharmacy at the end of the day to make sure medication is ready.**  ** Please notify patient to allow 48-72 hours to process.**  ** Encourage patient to contact the pharmacy for refills or they can request refills through [facility]**  Clinical Fills out below:   Last refill:  QTY:  Refill Date:    Other Comments:   Okay for refill?  Please advise.

## 2020-04-08 ENCOUNTER — Encounter: Payer: Self-pay | Admitting: Physician Assistant

## 2020-04-08 ENCOUNTER — Telehealth (INDEPENDENT_AMBULATORY_CARE_PROVIDER_SITE_OTHER): Payer: Managed Care, Other (non HMO) | Admitting: Physician Assistant

## 2020-04-08 DIAGNOSIS — F329 Major depressive disorder, single episode, unspecified: Secondary | ICD-10-CM

## 2020-04-08 DIAGNOSIS — I10 Essential (primary) hypertension: Secondary | ICD-10-CM | POA: Diagnosis not present

## 2020-04-08 MED ORDER — BUPROPION HCL ER (XL) 150 MG PO TB24: 150.0000 mg | ORAL_TABLET | Freq: Every day | ORAL | 1 refills | Status: DC

## 2020-04-08 NOTE — Progress Notes (Signed)
Virtual Visit via Video   I connected with patient on 04/08/20 at  2:30 PM EST by a video enabled telemedicine application and verified that I am speaking with the correct person using two identifiers.  Location patient: Home Location provider: [place] [facility], Office Persons participating in the virtual visit: Patient, Provider, CMA (Patina Moore)  I discussed the limitations of evaluation and management by telemedicine and the availability of in person appointments. The patient expressed understanding and agreed to proceed.  Subjective:   HPI:   Patient presents via [facility] today to follow-up of depression. Patient is currently on a regimen of Wellbutrin XL 150 mg daily. Endorses taking medication as directed and tolerating well.  Notes that she feels stable with medication. No labile mood. Denies breakthrough depressed mood or anhedonia.   Also with history of hypertension, currently on a regimen of lisinopril 20 mg daily and hydrochlorothiazide 12.5 mg daily.  Patient endorses taking her medications as directed and tolerating well.  Still trying to work on diet and exercise.  Is going to be starting a new job soon and she is hoping that the decrease in stress level will allow her to reduce some of her blood pressure medications. Patient denies chest pain, palpitations, lightheadedness, dizziness, vision changes or frequent headaches.  BP Readings from Last 3 Encounters:  10/26/19 118/82  03/17/18 118/80  01/09/18 114/78   ROS:   See pertinent positives and negatives per HPI.  Patient Active Problem List   Diagnosis Date Noted   Colon cancer screening 03/17/2018   Nontraumatic incomplete tear of right rotator cuff 09/26/2017   Essential hypertension 07/05/2017   Body mass index 29.0-29.9, adult 09/12/2015   Screening for breast cancer 09/12/2015   Post menopausal syndrome 10/11/2014   Abnormal thyroid function test 10/03/2014   Breast cancer screening  09/10/2014   Chronic constipation 09/10/2014   Weight gain 09/10/2014   Visit for preventive health examination 09/10/2014   Cervical disc disorder with radiculopathy of cervical region 05/27/2014    Social History   Tobacco Use   Smoking status: Never Smoker   Smokeless tobacco: Never Used  Substance Use Topics   Alcohol use: Yes    Alcohol/week: 0.0 standard drinks    Comment: rare    Current Outpatient Medications:    buPROPion (WELLBUTRIN XL) 150 MG 24 hr tablet, Take 1 tablet (150 mg total) by mouth daily. Need a follow up appointment for refills., Disp: 30 tablet, Rfl: 0   Cholecalciferol (VITAMIN D3) 5000 units CAPS, Take 5,000 Units by mouth daily., Disp: , Rfl:    estradiol (VIVELLE-DOT) 0.1 MG/24HR patch, Place 1 patch onto the skin once a week., Disp: , Rfl:    Ginger 500 MG CAPS, Take 1 capsule by mouth daily., Disp: , Rfl:    HM OMEGA-3-6-9 FATTY ACIDS CAPS, Take 2 each by mouth daily. Omega 3-5-6-7-9, Disp: , Rfl:    hydrochlorothiazide (HYDRODIURIL) 12.5 MG tablet, Take 1 tablet (12.5 mg total) by mouth daily., Disp: 90 tablet, Rfl: 1   lisinopril (ZESTRIL) 40 MG tablet, Take 0.5 tablets (20 mg total) by mouth daily., Disp: 90 tablet, Rfl: 1   Nutritional Supplements (JUICE PLUS FIBRE PO), Take 2 capsules by mouth daily. 2 Berries, 2 Vegs, 2 Fruits, Disp: , Rfl:    Probiotic Product (ALIGN) 4 MG CAPS, Take 1 capsule by mouth daily., Disp: , Rfl:    Turmeric 500 MG CAPS, Take 1 capsule by mouth daily., Disp: , Rfl:   No Known Allergies  Objective:   There were no vitals taken for this visit.  Patient is well-developed, well-nourished in no acute distress.  Resting comfortably at home.  Head is normocephalic, atraumatic.  No labored breathing.  Speech is clear and coherent with logical content.  Patient is alert and oriented at baseline.   Assessment and Plan:   1. Essential hypertension Doing well.  Asymptomatic.  Continue current regimen.   Patient to continue working on diet and exercise recommendations.  Continue periodic check of blood pressure measurement at home.  Follow-up 6 months for CPE.  Sooner if needed.  2. Reactive depression Doing very well on current regimen.  We will continue Wellbutrin 150 mg daily.  Follow-up 6 months.    Piedad Climes, PA-C 04/08/2020

## 2020-04-23 ENCOUNTER — Other Ambulatory Visit: Payer: Self-pay | Admitting: Physician Assistant

## 2020-04-23 DIAGNOSIS — I10 Essential (primary) hypertension: Secondary | ICD-10-CM

## 2020-05-07 ENCOUNTER — Encounter: Payer: Self-pay | Admitting: Physician Assistant

## 2020-08-04 ENCOUNTER — Other Ambulatory Visit: Payer: Self-pay | Admitting: Physician Assistant

## 2020-08-04 DIAGNOSIS — Z1231 Encounter for screening mammogram for malignant neoplasm of breast: Secondary | ICD-10-CM

## 2020-08-27 ENCOUNTER — Other Ambulatory Visit: Payer: Self-pay

## 2020-08-27 ENCOUNTER — Ambulatory Visit
Admission: RE | Admit: 2020-08-27 | Discharge: 2020-08-27 | Disposition: A | Discharge status: Home / Self Care | Payer: 59 | Source: Ambulatory Visit

## 2020-08-27 DIAGNOSIS — Z1231 Encounter for screening mammogram for malignant neoplasm of breast: Secondary | ICD-10-CM

## 2020-10-23 ENCOUNTER — Other Ambulatory Visit: Payer: Self-pay

## 2020-10-23 DIAGNOSIS — I10 Essential (primary) hypertension: Secondary | ICD-10-CM

## 2020-10-23 MED ORDER — HYDROCHLOROTHIAZIDE 12.5 MG PO TABS: 12.5000 mg | ORAL_TABLET | Freq: Every day | ORAL | 0 refills | Status: DC

## 2020-10-23 MED ORDER — LISINOPRIL 40 MG PO TABS: 20.0000 mg | ORAL_TABLET | Freq: Every day | ORAL | 0 refills | Status: DC

## 2020-11-21 ENCOUNTER — Other Ambulatory Visit: Payer: Self-pay

## 2020-11-21 DIAGNOSIS — F329 Major depressive disorder, single episode, unspecified: Secondary | ICD-10-CM

## 2020-11-21 MED ORDER — BUPROPION HCL ER (XL) 150 MG PO TB24: 150.0000 mg | ORAL_TABLET | Freq: Every day | ORAL | 0 refills | Status: DC

## 2020-12-09 ENCOUNTER — Ambulatory Visit: Payer: 59 | Admitting: Internal Medicine

## 2020-12-09 ENCOUNTER — Encounter: Payer: Self-pay | Admitting: Internal Medicine

## 2020-12-09 ENCOUNTER — Other Ambulatory Visit: Payer: Self-pay

## 2020-12-09 VITALS — BP 126/84 | HR 76 | Temp 98.6°F | Resp 18 | Ht 62.0 in | Wt 163.2 lb

## 2020-12-09 DIAGNOSIS — I10 Essential (primary) hypertension: Secondary | ICD-10-CM

## 2020-12-09 DIAGNOSIS — F5101 Primary insomnia: Secondary | ICD-10-CM

## 2020-12-09 DIAGNOSIS — M2550 Pain in unspecified joint: Secondary | ICD-10-CM

## 2020-12-09 DIAGNOSIS — N951 Menopausal and female climacteric states: Secondary | ICD-10-CM

## 2020-12-09 DIAGNOSIS — R946 Abnormal results of thyroid function studies: Secondary | ICD-10-CM | POA: Diagnosis not present

## 2020-12-09 DIAGNOSIS — Z1211 Encounter for screening for malignant neoplasm of colon: Secondary | ICD-10-CM

## 2020-12-09 MED ORDER — LISINOPRIL-HYDROCHLOROTHIAZIDE 20-12.5 MG PO TABS: 1.0000 | ORAL_TABLET | Freq: Every day | ORAL | 3 refills | Status: DC

## 2020-12-09 MED ORDER — TRAZODONE HCL 50 MG PO TABS: 25.0000 mg | ORAL_TABLET | Freq: Every day | ORAL | 3 refills | Status: DC

## 2020-12-09 NOTE — Patient Instructions (Addendum)
Think about the shingles vaccine.   We will have the cologuard sent to the house to check for colon cancer.   We have sent in trazodone to try for sleep.

## 2020-12-09 NOTE — Progress Notes (Signed)
   Subjective:   Patient ID: Karen Gregory, female    DOB: 13-Jan-1966, 55 y.o.   MRN: [id number]  HPI The patient is a 55 YO female coming in for transfer of care and needs ongoing care of her medical problems as well as worsening insomnia which she has had for some time. Does want to consider medication would only use rarely and does not want anything habit forming. Has tried otc and relaxation and has not helped.  PMH, [facility], social history reviewed and updated.   Review of Systems  Constitutional: Negative.   HENT: Negative.    Eyes: Negative.   Respiratory:  Negative for cough, chest tightness and shortness of breath.   Cardiovascular:  Negative for chest pain, palpitations and leg swelling.  Gastrointestinal:  Negative for abdominal distention, abdominal pain, constipation, diarrhea, nausea and vomiting.  Musculoskeletal:  Positive for myalgias.  Skin: Negative.   Neurological: Negative.   Psychiatric/Behavioral:  Positive for sleep disturbance.    Objective:  Physical Exam Constitutional:      Appearance: She is well-developed.  HENT:     Head: Normocephalic and atraumatic.  Cardiovascular:     Rate and Rhythm: Normal rate and regular rhythm.  Pulmonary:     Effort: Pulmonary effort is normal. No respiratory distress.     Breath sounds: Normal breath sounds. No wheezing or rales.  Abdominal:     General: Bowel sounds are normal. There is no distension.     Palpations: Abdomen is soft.     Tenderness: There is no abdominal tenderness. There is no rebound.  Musculoskeletal:        General: Tenderness present.     Cervical back: Normal range of motion.  Skin:    General: Skin is warm and dry.  Neurological:     Mental Status: She is alert and oriented to person, place, and time.     Coordination: Coordination normal.    Vitals:   12/09/20 1554  BP: 126/84  Pulse: 76  Resp: 18  Temp: 98.6 F (37 C)  TempSrc: Oral  SpO2: 98%  Weight: 163 lb 3.2 oz (74 kg)   Height: 5\' 2"  (1.575 m)    This visit occurred during the SARS-CoV-2 public health emergency.  Safety protocols were in place, including screening questions prior to the visit, additional usage of staff PPE, and extensive cleaning of exam room while observing appropriate contact time as indicated for disinfecting solutions.   Assessment & Plan:

## 2020-12-10 LAB — COMPREHENSIVE METABOLIC PANEL
ALT: 42 U/L — ABNORMAL HIGH (ref 0–35)
AST: 31 U/L (ref 0–37)
Albumin: 4.4 g/dL (ref 3.5–5.2)
Alkaline Phosphatase: 69 U/L (ref 39–117)
BUN: 25 mg/dL — ABNORMAL HIGH (ref 6–23)
CO2: 32 mEq/L (ref 19–32)
Calcium: 9.7 mg/dL (ref 8.4–10.5)
Chloride: 101 mEq/L (ref 96–112)
Creatinine, Ser: 0.87 mg/dL (ref 0.40–1.20)
GFR: 75.33 mL/min (ref >60.00)
Glucose, Bld: 85 mg/dL (ref 70–99)
Potassium: 3.8 mEq/L (ref 3.5–5.1)
Sodium: 139 mEq/L (ref 135–145)
Total Bilirubin: 0.5 mg/dL (ref 0.2–1.2)
Total Protein: 7.6 g/dL (ref 6.0–8.3)

## 2020-12-10 LAB — CBC
HCT: 40.9 % (ref 36.0–46.0)
Hemoglobin: 13.7 g/dL (ref 12.0–15.0)
MCHC: 33.4 g/dL (ref 30.0–36.0)
MCV: 92.3 fl (ref 78.0–100.0)
Platelets: 326 10*3/uL (ref 150.0–400.0)
RBC: 4.44 Mil/uL (ref 3.87–5.11)
RDW: 12.1 % (ref 11.5–15.5)
WBC: 8.3 10*3/uL (ref 4.0–10.5)

## 2020-12-10 LAB — LIPID PANEL
Cholesterol: 176 mg/dL (ref 0–200)
HDL: 59.5 mg/dL (ref >39.00)
LDL Cholesterol: 101 mg/dL — ABNORMAL HIGH (ref 0–99)
NonHDL: 116.32
Total CHOL/HDL Ratio: 3
Triglycerides: 79 mg/dL (ref 0.0–149.0)
VLDL: 15.8 mg/dL (ref 0.0–40.0)

## 2020-12-10 LAB — TSH: TSH: 0.74 u[IU]/mL (ref 0.35–5.50)

## 2020-12-10 LAB — VITAMIN D 25 HYDROXY (VIT D DEFICIENCY, FRACTURES): VITD: 77.67 ng/mL (ref 30.00–100.00)

## 2020-12-12 DIAGNOSIS — M2550 Pain in unspecified joint: Secondary | ICD-10-CM | POA: Insufficient documentation

## 2020-12-12 DIAGNOSIS — G4700 Insomnia, unspecified: Secondary | ICD-10-CM | POA: Insufficient documentation

## 2020-12-12 LAB — ANA, IFA COMPREHENSIVE PANEL
Anti Nuclear Antibody (ANA): NEGATIVE
ENA SM Ab Ser-aCnc: <1 AI
SM/RNP: <1 AI
SSA (Ro) (ENA) Antibody, IgG: <1 AI
SSB (La) (ENA) Antibody, IgG: <1 AI
Scleroderma (Scl-70) (ENA) Antibody, IgG: <1 AI
ds DNA Ab: 1 IU/mL

## 2020-12-12 NOTE — Assessment & Plan Note (Signed)
Checking ANA and comprehensive panel. There is a lot of auto-immune disease in her family raising the suspicion for this in her with some increase in joint pain and stiffness global in the last several months.

## 2020-12-12 NOTE — Assessment & Plan Note (Signed)
Checking TSH and adjust as needed. 

## 2020-12-12 NOTE — Assessment & Plan Note (Signed)
BP is at goal. She is taking hctz and lisinopril in separate pills currently and having to cut lisinopril in half which does not make sense. We will convert this to lisinopril/hctz 20/12.5 mg daily which is her same dosing but more convenient. She is due for CMP today which is ordered.

## 2020-12-12 NOTE — Assessment & Plan Note (Signed)
Rx for trazodone to use prn for sleep. We talked about good sleep hygiene which she tries to do.

## 2020-12-12 NOTE — Assessment & Plan Note (Signed)
Using wellbutrin and estrogen patch for her menopausal symptoms.

## 2020-12-12 NOTE — Assessment & Plan Note (Signed)
Ordered cologuard which she thinks she will try.

## 2020-12-31 ENCOUNTER — Telehealth: Payer: 59 | Admitting: Physician Assistant

## 2020-12-31 DIAGNOSIS — M545 Low back pain, unspecified: Secondary | ICD-10-CM

## 2020-12-31 MED ORDER — NAPROXEN 500 MG PO TABS: 500.0000 mg | ORAL_TABLET | Freq: Two times a day (BID) | ORAL | 0 refills | Status: DC

## 2020-12-31 MED ORDER — CYCLOBENZAPRINE HCL 10 MG PO TABS: 10.0000 mg | ORAL_TABLET | Freq: Three times a day (TID) | ORAL | 0 refills | Status: DC | PRN

## 2020-12-31 NOTE — Progress Notes (Signed)

## 2021-01-04 ENCOUNTER — Other Ambulatory Visit: Payer: Self-pay | Admitting: Internal Medicine

## 2021-02-16 ENCOUNTER — Other Ambulatory Visit: Payer: Self-pay | Admitting: Internal Medicine

## 2021-02-16 DIAGNOSIS — F329 Major depressive disorder, single episode, unspecified: Secondary | ICD-10-CM

## 2021-04-13 ENCOUNTER — Telehealth: Payer: Self-pay | Admitting: Internal Medicine

## 2021-04-13 NOTE — Telephone Encounter (Signed)
Pt connected to [facility] 11.25.2022.   Caller states she has COVID. States she has high blood pressure. States she is having a cough, fatigue, and chest pain. Wants to know what they should be doing.   Advised to go to ED.

## 2021-04-28 ENCOUNTER — Telehealth: Payer: 59 | Admitting: Emergency Medicine

## 2021-04-28 NOTE — Progress Notes (Signed)
Pt is in [place].  Will need to get local care.

## 2021-05-04 ENCOUNTER — Telehealth: Payer: 59 | Admitting: Physician Assistant

## 2021-05-04 DIAGNOSIS — J208 Acute bronchitis due to other specified organisms: Secondary | ICD-10-CM | POA: Diagnosis not present

## 2021-05-04 DIAGNOSIS — B9689 Other specified bacterial agents as the cause of diseases classified elsewhere: Secondary | ICD-10-CM | POA: Diagnosis not present

## 2021-05-04 MED ORDER — AZITHROMYCIN 250 MG PO TABS: ORAL_TABLET | ORAL | 0 refills | Status: AC

## 2021-05-04 MED ORDER — PREDNISONE 10 MG (21) PO TBPK: ORAL_TABLET | ORAL | 0 refills | Status: DC

## 2021-05-04 MED ORDER — BENZONATATE 100 MG PO CAPS
100.0000 mg | ORAL_CAPSULE | Freq: Three times a day (TID) | ORAL | 0 refills | Status: DC | PRN
Start: 2021-05-04 — End: 2021-12-19

## 2021-05-04 NOTE — Progress Notes (Signed)

## 2021-10-01 ENCOUNTER — Other Ambulatory Visit: Payer: Self-pay | Admitting: Internal Medicine

## 2021-10-09 ENCOUNTER — Other Ambulatory Visit: Payer: Self-pay | Admitting: Internal Medicine

## 2021-10-09 DIAGNOSIS — Z1231 Encounter for screening mammogram for malignant neoplasm of breast: Secondary | ICD-10-CM

## 2021-10-23 ENCOUNTER — Ambulatory Visit
Admission: RE | Admit: 2021-10-23 | Discharge: 2021-10-23 | Disposition: A | Discharge status: Home / Self Care | Payer: 59 | Source: Ambulatory Visit | Attending: Internal Medicine | Admitting: Internal Medicine

## 2021-10-23 DIAGNOSIS — Z1231 Encounter for screening mammogram for malignant neoplasm of breast: Secondary | ICD-10-CM

## 2021-12-09 ENCOUNTER — Other Ambulatory Visit: Payer: Self-pay | Admitting: Internal Medicine

## 2021-12-19 ENCOUNTER — Telehealth: Payer: 59 | Admitting: Nurse Practitioner

## 2021-12-19 DIAGNOSIS — J069 Acute upper respiratory infection, unspecified: Secondary | ICD-10-CM

## 2021-12-19 MED ORDER — AZITHROMYCIN 250 MG PO TABS: ORAL_TABLET | ORAL | 0 refills | Status: AC

## 2021-12-19 MED ORDER — PROMETHAZINE-DM 6.25-15 MG/5ML PO SYRP: 5.0000 mL | ORAL_SOLUTION | Freq: Four times a day (QID) | ORAL | 0 refills | Status: DC | PRN

## 2021-12-19 NOTE — Patient Instructions (Signed)

## 2021-12-19 NOTE — Progress Notes (Addendum)
Virtual Visit Consent   Nyoka Lint, you are scheduled for a virtual visit with Mary-Margaret Daphine Deutscher, FNP, a [facility] provider, today.     Just as with appointments in the office, your consent must be obtained to participate.  Your consent will be active for this visit and any virtual visit you may have with one of our providers in the next 365 days.     If you have a MyChart account, a copy of this consent can be sent to you electronically.  All virtual visits are billed to your insurance company just like a traditional visit in the office.    As this is a virtual visit, video technology does not allow for your provider to perform a traditional examination.  This may limit your provider's ability to fully assess your condition.  If your provider identifies any concerns that need to be evaluated in person or the need to arrange testing (such as labs, EKG, etc.), we will make arrangements to do so.     Although advances in technology are sophisticated, we cannot ensure that it will always work on either your end or our end.  If the connection with a video visit is poor, the visit may have to be switched to a telephone visit.  With either a video or telephone visit, we are not always able to ensure that we have a secure connection.     I need to obtain your verbal consent now.   Are you willing to proceed with your visit today? YES   Karen Gregory has provided verbal consent on 12/19/2021 for a virtual visit (video or telephone).   Mary-Margaret Daphine Deutscher, FNP   Date: 12/19/2021 8:10 AM   Virtual Visit via Video Note   I, Mary-Margaret Daphine Deutscher, connected with Karen Gregory ([id number], 1965/09/27) on 12/19/21 at  8:15 AM EDT by a video-enabled telemedicine application and verified that I am speaking with the correct person using two identifiers.  Location: Patient: Virtual Visit Location Patient: Home Provider: Virtual Visit Location Provider: Mobile   I discussed the  limitations of evaluation and management by telemedicine and the availability of in person appointments. The patient expressed understanding and agreed to proceed.    History of Present Illness: Karen Gregory is a 56 y.o. who identifies as a female who was assigned female at birth, and is being seen today for resp infection.  HPI: URI  This is a new problem. The current episode started 1 to 4 weeks ago. The problem has been gradually worsening. There has been no fever. Associated symptoms include congestion, coughing, headaches, sinus pain and a sore throat. She has tried decongestant for the symptoms. The treatment provided mild (home covid test was negative) relief.    Review of Systems  HENT:  Positive for congestion, sinus pain and sore throat.   Respiratory:  Positive for cough.   Neurological:  Positive for headaches.    Problems:  Patient Active Problem List   Diagnosis Date Noted   Insomnia 12/12/2020   Arthralgia 12/12/2020   Colon cancer screening 03/17/2018   Nontraumatic incomplete tear of right rotator cuff 09/26/2017   Essential hypertension 07/05/2017   Post menopausal syndrome 10/11/2014   Abnormal thyroid function test 10/03/2014   Visit for preventive health examination 09/10/2014    Allergies: No Known Allergies Medications:  Current Outpatient Medications:    benzonatate (TESSALON) 100 MG capsule, Take 1 capsule (100 mg total) by mouth 3 (three) times daily as needed.,  Disp: 30 capsule, Rfl: 0   buPROPion (WELLBUTRIN XL) 150 MG 24 hr tablet, TAKE 1 TABLET BY MOUTH EVERY DAY, Disp: 90 tablet, Rfl: 3   Cholecalciferol (VITAMIN D3) 5000 units CAPS, Take 5,000 Units by mouth daily., Disp: , Rfl:    cyclobenzaprine (FLEXERIL) 10 MG tablet, Take 1 tablet (10 mg total) by mouth 3 (three) times daily as needed for muscle spasms., Disp: 30 tablet, Rfl: 0   estradiol (VIVELLE-DOT) 0.1 MG/24HR patch, Place 1 patch onto the skin once a week., Disp: , Rfl:    Ginger 500  MG CAPS, Take 1 capsule by mouth daily., Disp: , Rfl:    HM OMEGA-3-6-9 FATTY ACIDS CAPS, Take 2 each by mouth daily. Omega 3-5-6-7-9, Disp: , Rfl:    lisinopril-hydrochlorothiazide (ZESTORETIC) 20-12.5 MG tablet, TAKE 1 TABLET BY MOUTH EVERY DAY, Disp: 30 tablet, Rfl: 0   naproxen (NAPROSYN) 500 MG tablet, Take 1 tablet (500 mg total) by mouth 2 (two) times daily with a meal., Disp: 30 tablet, Rfl: 0   Nutritional Supplements (JUICE PLUS FIBRE PO), Take 2 capsules by mouth daily. 2 Berries, 2 Vegs, 2 Fruits, Disp: , Rfl:    predniSONE (STERAPRED UNI-PAK 21 TAB) 10 MG (21) TBPK tablet, 6 day taper; take as directed on package instructions, Disp: 21 tablet, Rfl: 0   Probiotic Product (ALIGN) 4 MG CAPS, Take 1 capsule by mouth daily., Disp: , Rfl:    traZODone (DESYREL) 50 MG tablet, TAKE 1/2 - 1 TABLETS (25-50 MG TOTAL) BY MOUTH AT BEDTIME., Disp: 90 tablet, Rfl: 0   Turmeric 500 MG CAPS, Take 1 capsule by mouth daily., Disp: , Rfl:   Observations/Objective: Patient is well-developed, well-nourished in no acute distress.  Resting comfortably  at home.  Head is normocephalic, atraumatic.  No labored breathing.  Speech is clear and coherent with logical content.  Patient is alert and oriented at baseline.  Raspy voice Deep wet cough  Assessment and Plan:  Nyoka Lint in today with chief complaint of URI   1. URI with cough and congestion 1. Take meds as prescribed 2. Use a cool mist humidifier especially during the winter months and when heat has been humid. 3. Use saline nose sprays frequently 4. Saline irrigations of the nose can be very helpful if done frequently.  * 4X daily for 1 week*  * Use of a nettie pot can be helpful with this. Follow directions with this* 5. Drink plenty of fluids 6. Keep thermostat turn down low 7.For any cough or congestion- promethazine DM 8. For fever or aces or pains- take tylenol or ibuprofen appropriate for age and weight.  * for fevers  greater than 101 orally you may alternate ibuprofen and tylenol every  3 hours.    Meds ordered this encounter  Medications   azithromycin (ZITHROMAX) 250 MG tablet    Sig: Take 2 tablets on day 1, then 1 tablet daily on days 2 through 5    Dispense:  6 tablet    Refill:  0    Order Specific Question:   Supervising Provider    Answer:   Eber Hong [[id number]]   promethazine-dextromethorphan (PROMETHAZINE-DM) 6.25-15 MG/5ML syrup    Sig: Take 5 mLs by mouth 4 (four) times daily as needed for cough.    Dispense:  118 mL    Refill:  0    Order Specific Question:   Supervising Provider    Answer:   Eber Hong [[id number]]     Follow  Up Instructions: I discussed the assessment and treatment plan with the patient. The patient was provided an opportunity to ask questions and all were answered. The patient agreed with the plan and demonstrated an understanding of the instructions.  A copy of instructions were sent to the patient via MyChart.  The patient was advised to call back or seek an in-person evaluation if the symptoms worsen or if the condition fails to improve as anticipated.  Time:  I spent 9 minutes with the patient via telehealth technology discussing the above problems/concerns.    Mary-Margaret Daphine Deutscher, FNP

## 2021-12-23 ENCOUNTER — Other Ambulatory Visit: Payer: Self-pay | Admitting: Internal Medicine

## 2021-12-31 ENCOUNTER — Other Ambulatory Visit: Payer: Self-pay | Admitting: Internal Medicine

## 2022-02-08 ENCOUNTER — Other Ambulatory Visit: Payer: Self-pay | Admitting: Internal Medicine

## 2022-02-08 DIAGNOSIS — F329 Major depressive disorder, single episode, unspecified: Secondary | ICD-10-CM

## 2022-02-11 NOTE — Telephone Encounter (Signed)
Once a visit is made with provider can have 30 day fill no refills. Last visit 11/2020

## 2022-02-23 ENCOUNTER — Ambulatory Visit (INDEPENDENT_AMBULATORY_CARE_PROVIDER_SITE_OTHER): Payer: 59 | Admitting: Internal Medicine

## 2022-02-23 ENCOUNTER — Encounter: Payer: Self-pay | Admitting: Internal Medicine

## 2022-02-23 VITALS — BP 120/79 | HR 84 | Temp 97.6°F | Ht 62.0 in | Wt 159.0 lb

## 2022-02-23 DIAGNOSIS — I10 Essential (primary) hypertension: Secondary | ICD-10-CM | POA: Diagnosis not present

## 2022-02-23 DIAGNOSIS — Z0000 Encounter for general adult medical examination without abnormal findings: Secondary | ICD-10-CM | POA: Diagnosis not present

## 2022-02-23 DIAGNOSIS — N951 Menopausal and female climacteric states: Secondary | ICD-10-CM

## 2022-02-23 DIAGNOSIS — F5101 Primary insomnia: Secondary | ICD-10-CM | POA: Diagnosis not present

## 2022-02-23 DIAGNOSIS — Z1211 Encounter for screening for malignant neoplasm of colon: Secondary | ICD-10-CM

## 2022-02-23 NOTE — Progress Notes (Signed)
   Subjective:   Patient ID: Karen Gregory, female    DOB: [birth year], 56 y.o.   MRN: [id number]  HPI The patient is here for physical.  PMH, [facility], social history reviewed and updated  Review of Systems  Constitutional: Negative.   HENT: Negative.    Eyes: Negative.   Respiratory:  Negative for cough, chest tightness and shortness of breath.   Cardiovascular:  Negative for chest pain, palpitations and leg swelling.  Gastrointestinal:  Negative for abdominal distention, abdominal pain, constipation, diarrhea, nausea and vomiting.  Musculoskeletal: Negative.   Skin: Negative.   Neurological: Negative.   Psychiatric/Behavioral: Negative.      Objective:  Physical Exam Constitutional:      Appearance: She is well-developed.  HENT:     Head: Normocephalic and atraumatic.  Cardiovascular:     Rate and Rhythm: Normal rate and regular rhythm.  Pulmonary:     Effort: Pulmonary effort is normal. No respiratory distress.     Breath sounds: Normal breath sounds. No wheezing or rales.  Abdominal:     General: Bowel sounds are normal. There is no distension.     Palpations: Abdomen is soft.     Tenderness: There is no abdominal tenderness. There is no rebound.  Musculoskeletal:     Cervical back: Normal range of motion.  Skin:    General: Skin is warm and dry.  Neurological:     Mental Status: She is alert and oriented to person, place, and time.     Coordination: Coordination normal.     Vitals:   02/23/22 1607  BP: 120/79  Pulse: 84  Temp: 97.6 F (36.4 C)  TempSrc: Oral  SpO2: 98%  Weight: 159 lb (72.1 kg)  Height: 5\' 2"  (1.575 m)    Assessment & Plan:

## 2022-02-26 ENCOUNTER — Encounter: Payer: Self-pay | Admitting: Gastroenterology

## 2022-02-26 NOTE — Assessment & Plan Note (Signed)
BP at goal on lisinopril/hctz 20/12.5 mg daily. Continue and labs up to date.

## 2022-02-26 NOTE — Assessment & Plan Note (Signed)
Tried trazodone and this caused problems but improved slightly overall.

## 2022-02-26 NOTE — Assessment & Plan Note (Signed)
Flu shot declines. Covid-19 counseled. Shingrix declines. Tetanus declines. Colonoscopy referral placed. Mammogram up to date, pap smear not indicated. Counseled about sun safety and mole surveillance. Counseled about the dangers of distracted driving. Given 10 year screening recommendations.

## 2022-02-26 NOTE — Assessment & Plan Note (Signed)
Refer to GI as due.

## 2022-02-26 NOTE — Assessment & Plan Note (Signed)
Taking wellbutrin 150 mg daily and finally on hormones which are helping with her symptoms.

## 2022-03-11 ENCOUNTER — Ambulatory Visit (AMBULATORY_SURGERY_CENTER): Payer: Self-pay

## 2022-03-11 VITALS — Ht 62.0 in | Wt 155.0 lb

## 2022-03-11 DIAGNOSIS — Z1211 Encounter for screening for malignant neoplasm of colon: Secondary | ICD-10-CM

## 2022-03-11 MED ORDER — NA SULFATE-K SULFATE-MG SULF 17.5-3.13-1.6 GM/177ML PO SOLN: 1.0000 | ORAL | 0 refills | Status: DC

## 2022-03-11 NOTE — Progress Notes (Signed)

## 2022-03-12 ENCOUNTER — Encounter: Payer: Self-pay | Admitting: Gastroenterology

## 2022-03-22 ENCOUNTER — Encounter: Payer: Self-pay | Admitting: Gastroenterology

## 2022-03-22 ENCOUNTER — Ambulatory Visit (AMBULATORY_SURGERY_CENTER): Payer: 59 | Admitting: Gastroenterology

## 2022-03-22 VITALS — BP 122/76 | HR 60 | Temp 96.8°F | Resp 17 | Ht 62.0 in | Wt 155.0 lb

## 2022-03-22 DIAGNOSIS — K5730 Diverticulosis of large intestine without perforation or abscess without bleeding: Secondary | ICD-10-CM

## 2022-03-22 DIAGNOSIS — Z1211 Encounter for screening for malignant neoplasm of colon: Secondary | ICD-10-CM | POA: Diagnosis present

## 2022-03-22 MED ORDER — SODIUM CHLORIDE 0.9 % IV SOLN: 500.0000 mL | INTRAVENOUS | Status: DC

## 2022-03-22 NOTE — Patient Instructions (Signed)
YOU HAD AN ENDOSCOPIC PROCEDURE TODAY AT [facility]:   Refer to the procedure report that was given to you for any specific questions about what was found during the examination.  If the procedure report does not answer your questions, please call your gastroenterologist to clarify.  If you requested that your care partner not be given the details of your procedure findings, then the procedure report has been included in a sealed envelope for you to review at your convenience later.  YOU SHOULD EXPECT: Some feelings of bloating in the abdomen. Passage of more gas than usual.  Walking can help get rid of the air that was put into your GI tract during the procedure and reduce the bloating. If you had a lower endoscopy (such as a colonoscopy or flexible sigmoidoscopy) you may notice spotting of blood in your stool or on the toilet paper. If you underwent a bowel prep for your procedure, you may not have a normal bowel movement for a few days.  Please Note:  You might notice some irritation and congestion in your nose or some drainage.  This is from the oxygen used during your procedure.  There is no need for concern and it should clear up in a day or so.  SYMPTOMS TO REPORT IMMEDIATELY:  Following lower endoscopy (colonoscopy or flexible sigmoidoscopy):  Excessive amounts of blood in the stool  Significant tenderness or worsening of abdominal pains  Swelling of the abdomen that is new, acute  Fever of 100F or higher   For urgent or emergent issues, a gastroenterologist can be reached at any hour by calling [phone]. Do not use MyChart messaging for urgent concerns.    DIET:  We do recommend a small meal at first, but then you may proceed to your regular diet.  Drink plenty of fluids but you should avoid alcoholic beverages for 24 hours.  MEDICATIONS: Continue present medications.  Please see handouts given to you by your recovery nurse: diverticulosis.  FOLLOW UP:  Repeat colonoscopy in 10 years for screening purposes. Return to GI office as needed.  Thank you for allowing [place] to provide for your healthcare needs today.  ACTIVITY:  You should plan to take it easy for the rest of today and you should NOT DRIVE or use heavy machinery until tomorrow (because of the sedation medicines used during the test).    FOLLOW UP: Our staff will call the number listed on your records the next business day following your procedure.  We will call around 7:15- 8:00 am to check on you and address any questions or concerns that you may have regarding the information given to you following your procedure. If we do not reach you, we will leave a message.     If any biopsies were taken you will be contacted by phone or by letter within the next 1-3 weeks.  Please call us at [phone] if you have not heard about the biopsies in 3 weeks.    SIGNATURES/CONFIDENTIALITY: You and/or your care partner have signed paperwork which will be entered into your electronic medical record.  These signatures attest to the fact that that the information above on your After Visit Summary has been reviewed and is understood.  Full responsibility of the confidentiality of this discharge information lies with you and/or your care-partner.

## 2022-03-22 NOTE — Progress Notes (Signed)
Pt resting comfortably. VSS. Airway intact. SBAR complete to RN. All questions answered.   

## 2022-03-22 NOTE — Progress Notes (Signed)
GASTROENTEROLOGY PROCEDURE H&P NOTE   Primary Care Physician: Hoyt Koch, MD    Reason for Procedure:  Colon Cancer screening  Plan:    Colonoscopy  Patient is appropriate for endoscopic procedure(s) in the ambulatory ([place]) setting.  The nature of the procedure, as well as the risks, benefits, and alternatives were carefully and thoroughly reviewed with the patient. Ample time for discussion and questions allowed. The patient understood, was satisfied, and agreed to proceed.     HPI: Karen Gregory is a 56 y.o. female who presents for colonoscopy for routine Colon Cancer screening.  No active GI symptoms.  No known family history of colon cancer or related malignancy.  Patient is otherwise without complaints or active issues today.  Past Medical History:  Diagnosis Date   Cervical disc disease    Elevated blood pressure (not hypertension)    Hypertension    Impingement syndrome of right shoulder     Past Surgical History:  Procedure Laterality Date   NECK SURGERY  07/23/2014   Cervical Disk   SHOULDER SURGERY  2013   Right, Bursa   TONSILLECTOMY  age 57   TOTAL SHOULDER ARTHROPLASTY     VAGINAL HYSTERECTOMY  03-09-2000   W/ LEFT SALPINGOOPHECTOMY    Prior to Admission medications   Medication Sig Start Date End Date Taking? Authorizing Provider  buPROPion (WELLBUTRIN XL) 150 MG 24 hr tablet TAKE 1 TABLET BY MOUTH EVERY DAY 02/16/22   Hoyt Koch, MD  Cholecalciferol (VITAMIN D3) 5000 units CAPS Take 5,000 Units by mouth daily.    [provider]  cyclobenzaprine (FLEXERIL) 10 MG tablet Take 1 tablet (10 mg total) by mouth 3 (three) times daily as needed for muscle spasms. Patient not taking: Reported on 02/23/2022 12/31/20   Mar Daring, PA-C  estradiol (VIVELLE-DOT) 0.1 MG/24HR patch Place 1 patch onto the skin once a week.    [provider]  Ginger 500 MG CAPS Take 1 capsule by mouth daily.    [provider]  HM OMEGA-3-6-9 FATTY ACIDS CAPS Take 2 each by mouth daily. Omega 3-5-6-7-9    [provider]  lisinopril-hydrochlorothiazide (ZESTORETIC) 20-12.5 MG tablet TAKE 1 TABLET BY MOUTH EVERY DAY 12/09/21   Hoyt Koch, MD  Na Sulfate-K Sulfate-Mg Sulf 17.5-3.13-1.6 GM/177ML SOLN Take 1 kit by mouth as directed. May use generic Suprep, no prior authorization. Take as directed. 03/11/22   Brighten Orndoff V, DO  Nutritional Supplements (JUICE PLUS FIBRE PO) Take 2 capsules by mouth daily. 2 Berries, 2 Vegs, 2 Fruits    [provider]  Probiotic Product (ALIGN) 4 MG CAPS Take 1 capsule by mouth daily.    [provider]  Turmeric 500 MG CAPS Take 1 capsule by mouth daily.    [provider]    Current Outpatient Medications  Medication Sig Dispense Refill   buPROPion (WELLBUTRIN XL) 150 MG 24 hr tablet TAKE 1 TABLET BY MOUTH EVERY DAY 30 tablet 0   Cholecalciferol (VITAMIN D3) 5000 units CAPS Take 5,000 Units by mouth daily.     cyclobenzaprine (FLEXERIL) 10 MG tablet Take 1 tablet (10 mg total) by mouth 3 (three) times daily as needed for muscle spasms. (Patient not taking: Reported on 02/23/2022) 30 tablet 0   estradiol (VIVELLE-DOT) 0.1 MG/24HR patch Place 1 patch onto the skin once a week.     Ginger 500 MG CAPS Take 1 capsule by mouth daily.     HM OMEGA-3-6-9 FATTY  ACIDS CAPS Take 2 each by mouth daily. Omega 3-5-6-7-9     lisinopril-hydrochlorothiazide (ZESTORETIC) 20-12.5 MG tablet TAKE 1 TABLET BY MOUTH EVERY DAY 30 tablet 0   Na Sulfate-K Sulfate-Mg Sulf 17.5-3.13-1.6 GM/177ML SOLN Take 1 kit by mouth as directed. May use generic Suprep, no prior authorization. Take as directed. 354 mL 0   Nutritional Supplements (JUICE PLUS FIBRE PO) Take 2 capsules by mouth daily. 2 Berries, 2 Vegs, 2 Fruits     Probiotic Product (ALIGN) 4 MG CAPS Take 1 capsule by mouth daily.     Turmeric 500 MG CAPS Take 1 capsule by mouth daily.     No  current facility-administered medications for this visit.    Allergies as of 03/22/2022   (No Known Allergies)    Family History  Adopted: Yes  Problem Relation Age of Onset   Fibromyalgia Mother    Hypertension Mother    Heart disease Father        Living   Allergies Brother    Autoimmune disease Brother    Autoimmune disease Brother    Autoimmune disease Brother    Polycystic ovary syndrome Daughter        x1   Healthy Son        x2   Breast cancer Neg Hx     Social History   Socioeconomic History   Marital status: Married    Spouse name: Not on file   Number of children: Not on file   Years of education: Not on file   Highest education level: Not on file  Occupational History    Comment: Development worker, [employer]  Tobacco Use   Smoking status: Never   Smokeless tobacco: Never  Vaping Use   Vaping Use: Never used  Substance and Sexual Activity   Alcohol use: Yes    Alcohol/week: 0.0 standard drinks of alcohol    Comment: rare   Drug use: No   Sexual activity: Yes    Birth control/protection: Surgical  Other Topics Concern   Not on file  Social History Narrative   Not on file   Social Determinants of Health   Financial Resource Strain: Not on file  Food Insecurity: Not on file  Transportation Needs: Not on file  Physical Activity: Not on file  Stress: Not on file  Social Connections: Not on file  Intimate Partner Violence: Not on file    Physical Exam: Vital signs in last 24 hours: _0  were no vitals taken for this visit. GEN: NAD EYE: Sclerae anicteric ENT: MMM CV: Non-tachycardic Pulm: CTA b/l GI: Soft, NT/ND NEURO:  Alert & Oriented x 3   Gerrit Heck, DO [facility]   03/22/2022 10:32 AM

## 2022-03-22 NOTE — Progress Notes (Signed)
Pt's states no medical or surgical changes since previsit or office visit. 

## 2022-03-22 NOTE — Progress Notes (Signed)
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 There were no hemorrhoids seen on today's procedure. Spoke with Dr. Bryan Lemma who advises this is probably due to hemorrhoids that come and go and that patient may use an over-the-counter hemorrhoid remedy, but for no more than 14 days continuously as this can cause skin breakdown. She may also come into the office for a hemorrhoid banding procedure if they are bothersome. Advised the above to patient and care partner prior to discharge and both she and care partner verbalize understanding.

## 2022-03-22 NOTE — Op Note (Signed)
[place] Patient Name: Karen Gregory Procedure Date: 03/22/2022 11:40 AM MRN: [id number] Endoscopist: Gerrit Heck , MD, [id number] Age: 56 Referring MD:  Date of Birth: 03-Sep-1965 Gender: Female Account #: [id number] Procedure:                Colonoscopy Indications:              Screening for colorectal malignant neoplasm, This                            is the patient's first colonoscopy Medicines:                Monitored Anesthesia Care Procedure:                Pre-Anesthesia Assessment:                           - Prior to the procedure, a History and Physical                            was performed, and patient medications and                            allergies were reviewed. The patient's tolerance of                            previous anesthesia was also reviewed. The risks                            and benefits of the procedure and the sedation                            options and risks were discussed with the patient.                            All questions were answered, and informed consent                            was obtained. Prior Anticoagulants: The patient has                            taken no anticoagulant or antiplatelet agents. ASA                            Grade Assessment: II - A patient with mild systemic                            disease. After reviewing the risks and benefits,                            the patient was deemed in satisfactory condition to                            undergo the procedure.  After obtaining informed consent, the colonoscope                            was passed under direct vision. Throughout the                            procedure, the patient's blood pressure, pulse, and                            oxygen saturations were monitored continuously. The                            Olympus CF-HQ190L [id number]) Colonoscope was                            introduced through the  anus and advanced to the the                            terminal ileum. The colonoscopy was performed                            without difficulty. The patient tolerated the                            procedure well. The quality of the bowel                            preparation was excellent. The terminal ileum,                            ileocecal valve, appendiceal orifice, and rectum                            were photographed. Scope In: 11:53:08 AM Scope Out: 12:07:06 PM Scope Withdrawal Time: 0 hours 9 minutes 47 seconds  Total Procedure Duration: 0 hours 13 minutes 58 seconds  Findings:                 The perianal and digital rectal examinations were                            normal.                           Multiple large-mouthed and small-mouthed                            diverticula were found in the sigmoid colon and                            descending colon.                           The colon otherwise appeared normal throughout.  The retroflexed view of the distal rectum and anal                            verge was normal and showed no anal or rectal                            abnormalities.                           The terminal ileum appeared normal. Complications:            No immediate complications. Estimated Blood Loss:     Estimated blood loss: none. Impression:               - Diverticulosis in the sigmoid colon and in the                            descending colon.                           - The entire colon is otherwise normal.                           - The distal rectum and anal verge are normal on                            retroflexion view.                           - The examined portion of the ileum was normal.                           - No specimens collected. Recommendation:           - Patient has a contact number available for                            emergencies. The signs and symptoms of potential                             delayed complications were discussed with the                            patient. Return to normal activities tomorrow.                            Written discharge instructions were provided to the                            patient.                           - Resume previous diet.                           - Repeat colonoscopy in 10 years for screening  purposes.                           - Return to GI office PRN. Doristine Locks, MD 03/22/2022 12:13:05 PM

## 2022-03-23 ENCOUNTER — Telehealth: Payer: Self-pay

## 2022-03-23 NOTE — Telephone Encounter (Signed)
No answer on follow up call. Voicemail full.  

## 2022-03-27 ENCOUNTER — Other Ambulatory Visit: Payer: Self-pay | Admitting: Internal Medicine

## 2022-04-05 ENCOUNTER — Other Ambulatory Visit: Payer: Self-pay

## 2022-04-05 DIAGNOSIS — F329 Major depressive disorder, single episode, unspecified: Secondary | ICD-10-CM

## 2022-04-05 MED ORDER — BUPROPION HCL ER (XL) 150 MG PO TB24: 150.0000 mg | ORAL_TABLET | Freq: Every day | ORAL | 0 refills | Status: DC

## 2022-04-29 ENCOUNTER — Other Ambulatory Visit: Payer: Self-pay | Admitting: Internal Medicine

## 2022-04-29 DIAGNOSIS — F329 Major depressive disorder, single episode, unspecified: Secondary | ICD-10-CM

## 2022-07-04 ENCOUNTER — Other Ambulatory Visit: Payer: Self-pay | Admitting: Internal Medicine

## 2022-11-05 ENCOUNTER — Other Ambulatory Visit: Payer: Self-pay | Admitting: Internal Medicine

## 2022-11-05 DIAGNOSIS — F329 Major depressive disorder, single episode, unspecified: Secondary | ICD-10-CM

## 2022-12-09 ENCOUNTER — Other Ambulatory Visit: Payer: Self-pay | Admitting: Internal Medicine

## 2022-12-14 ENCOUNTER — Other Ambulatory Visit: Payer: Self-pay | Admitting: Internal Medicine

## 2022-12-14 DIAGNOSIS — F329 Major depressive disorder, single episode, unspecified: Secondary | ICD-10-CM

## 2022-12-18 IMAGING — MG MM DIGITAL SCREENING BILAT W/ TOMO AND CAD
6 of 10 series · 6 of 30 positions shown · non-contrast
Comparison: Previous exam(s).

CLINICAL DATA: Screening. History of bilateral breast cysts.

EXAM:
DIGITAL SCREENING BILATERAL MAMMOGRAM WITH TOMOSYNTHESIS AND CAD
TECHNIQUE: Bilateral screening digital craniocaudal and mediolateral oblique
mammograms were obtained. Bilateral screening digital breast
tomosynthesis was performed. The images were evaluated with
computer-aided detection.

[L MLO synth-2D]
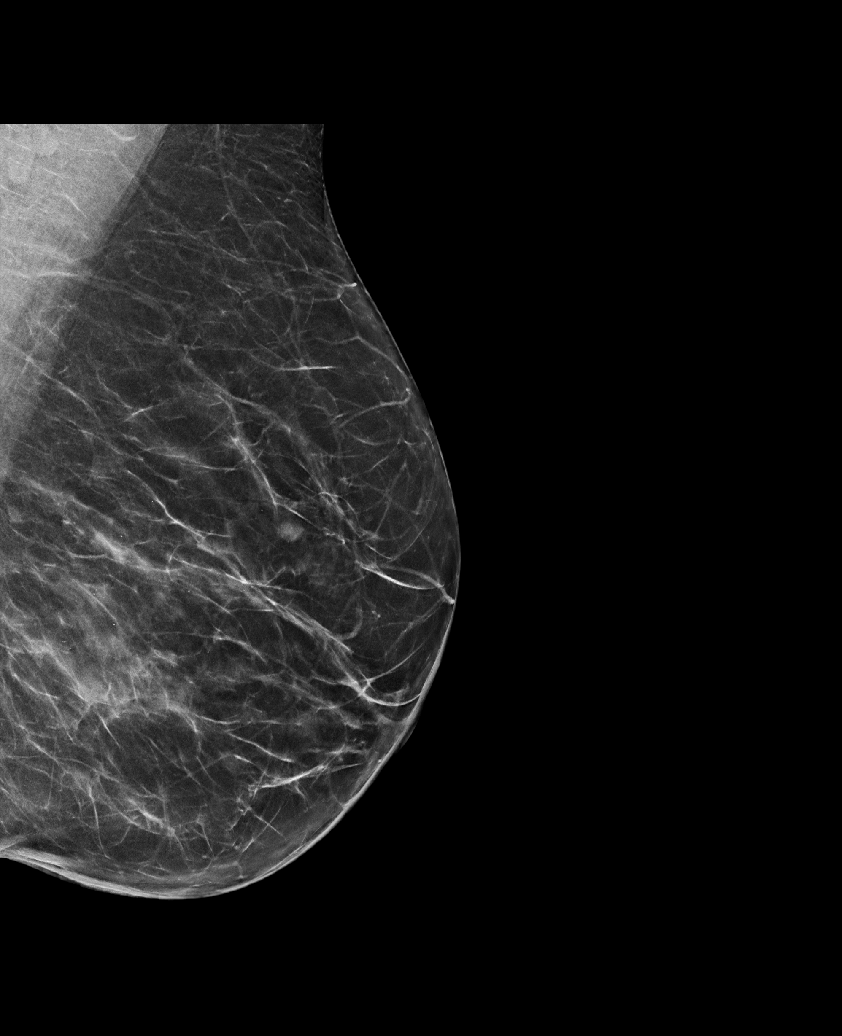

[R CC synth-2D]
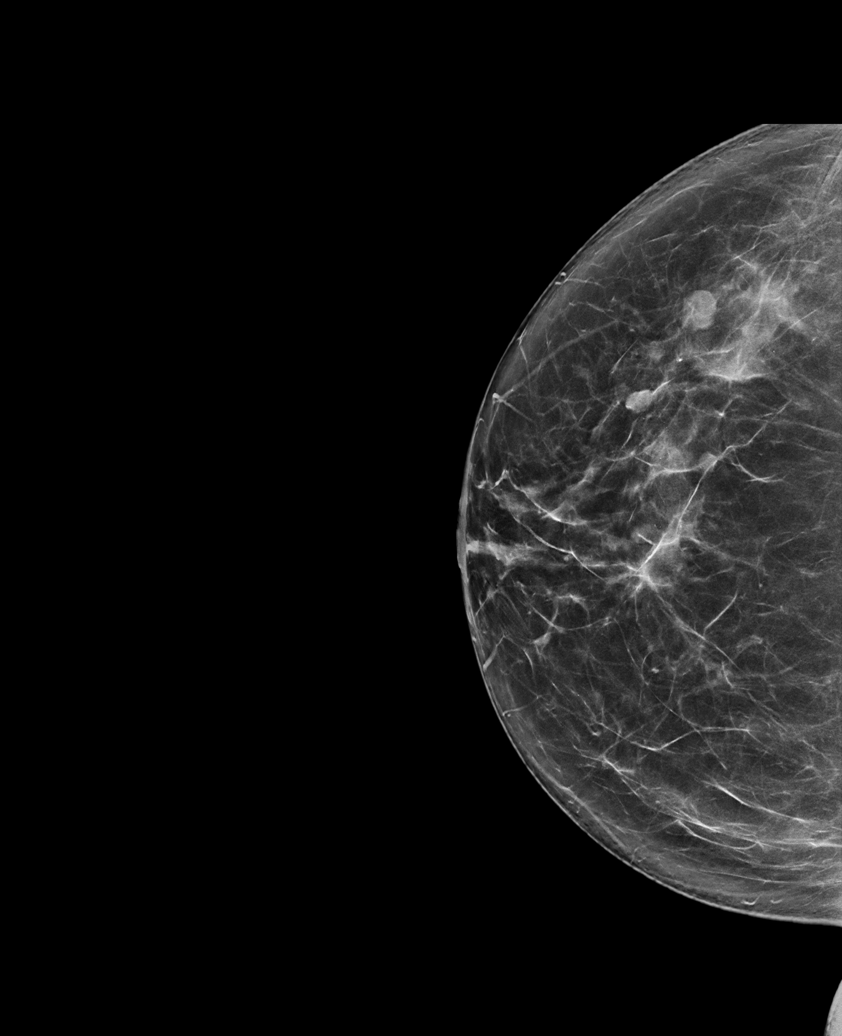

[R MLO synth-2D]
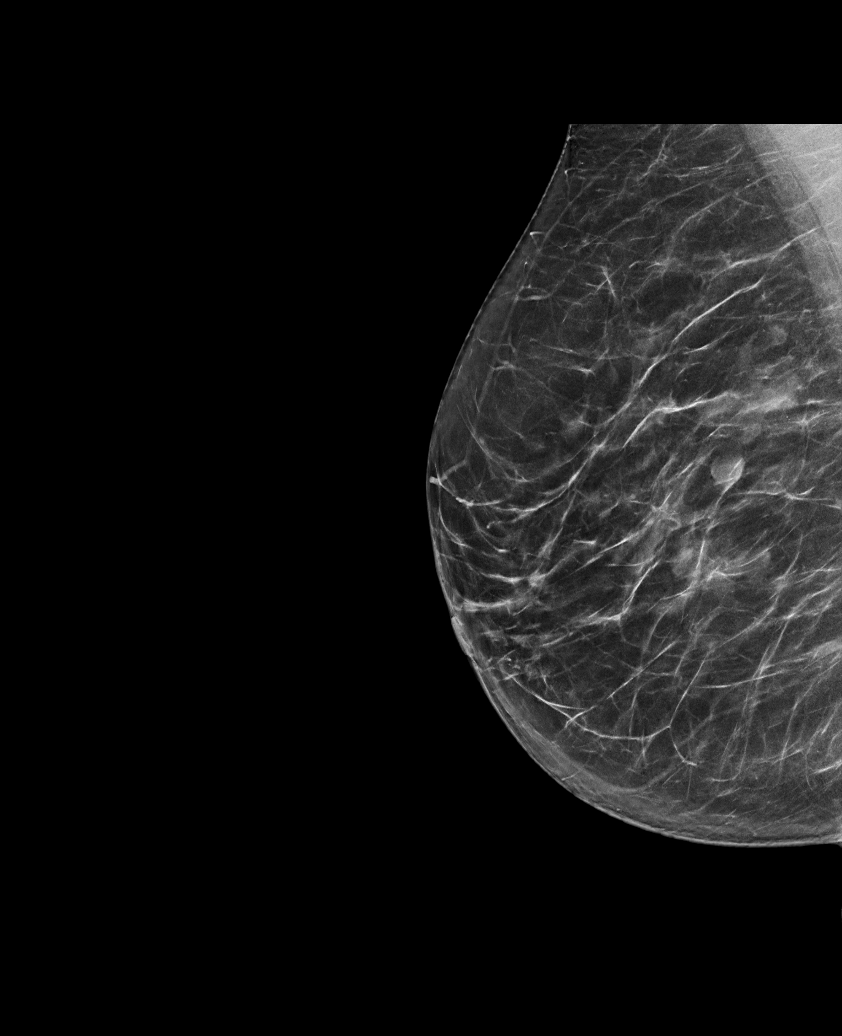

[L CC synth-2D (1 of 2)]
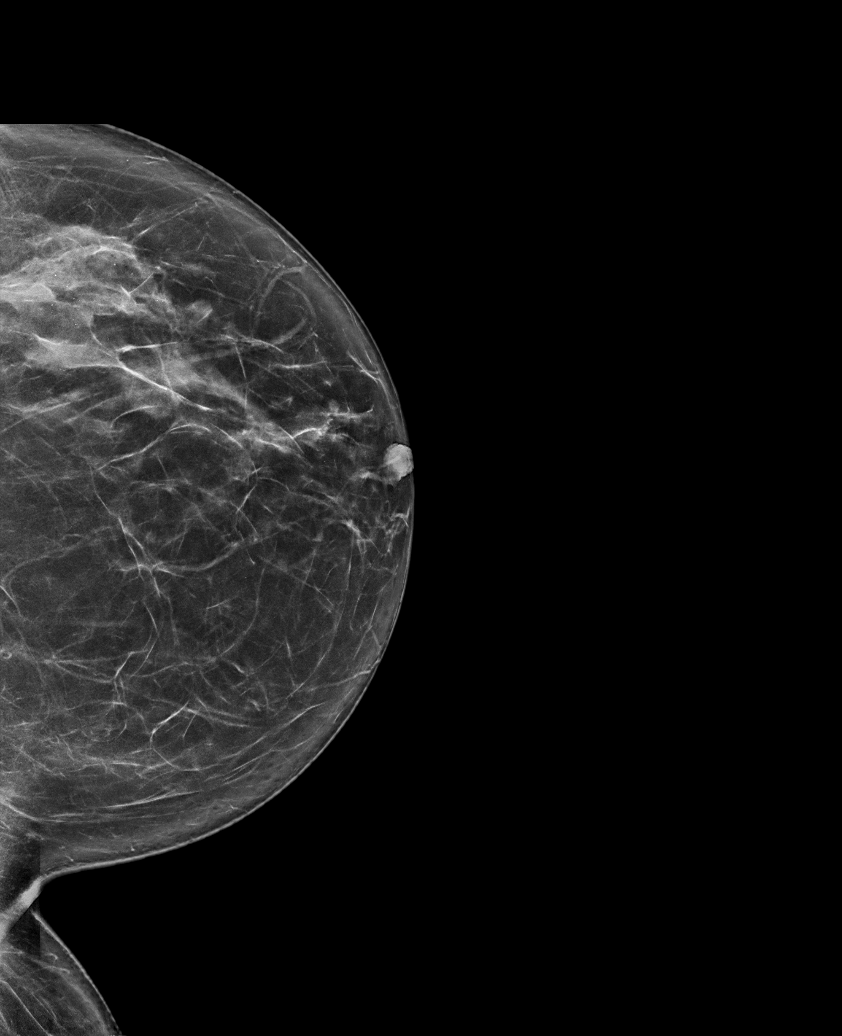

[L CC synth-2D (2 of 2)]
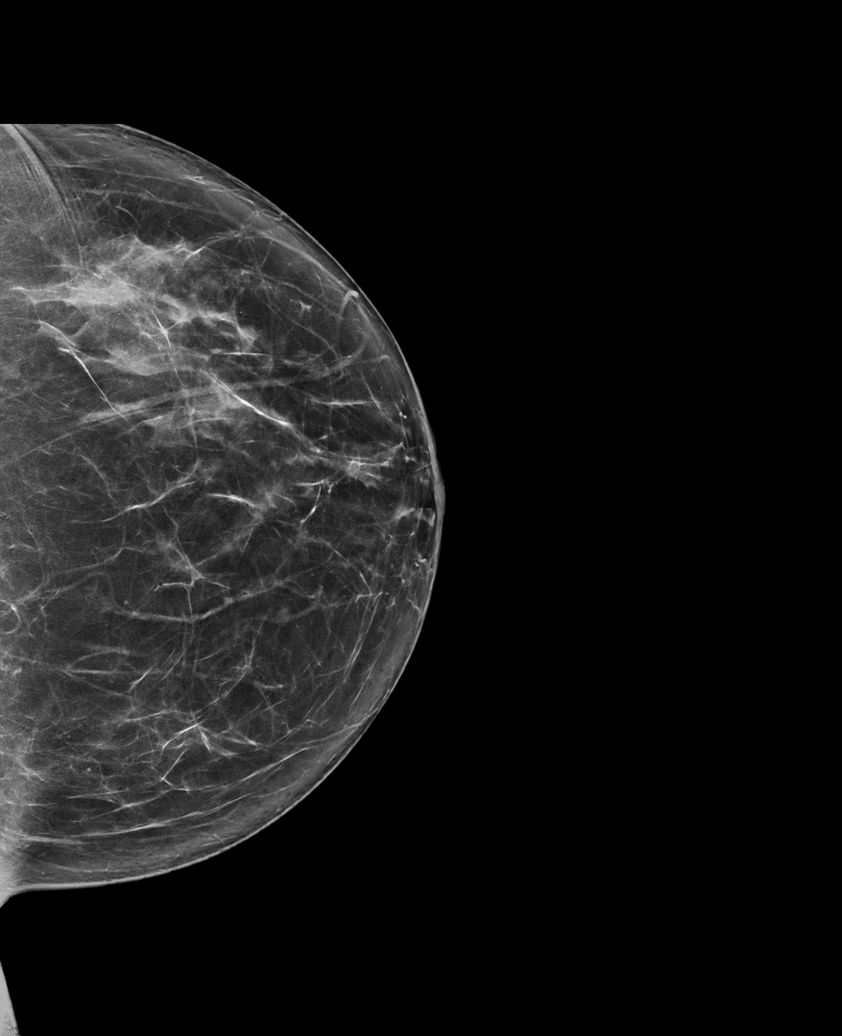

[L MLO tomo · tomo slice 37/72.0]
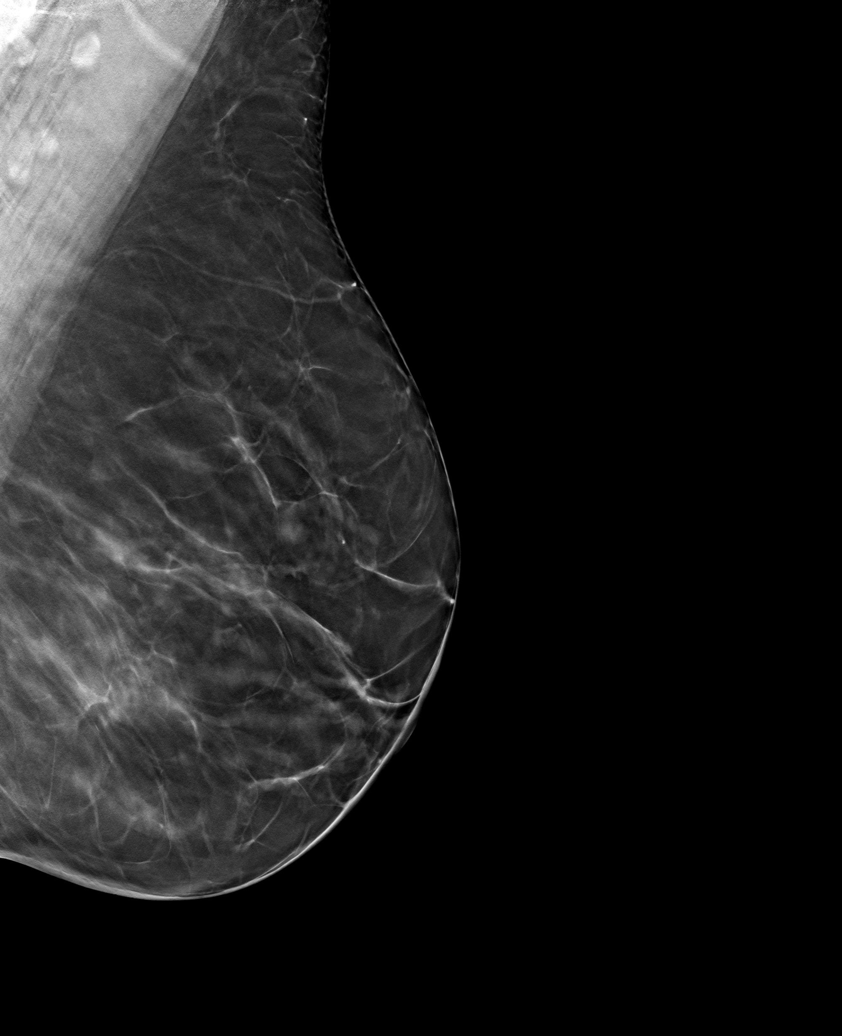

[6 of 30 positions shown; findings below may reference images not displayed]

ACR Breast Density Category b: There are scattered areas of
fibroglandular density.
FINDINGS: There are no findings suspicious for malignancy. The images were
evaluated with computer-aided detection.
IMPRESSION: No mammographic evidence of malignancy. A result letter of this
screening mammogram will be mailed directly to the patient.

RECOMMENDATION:
Screening mammogram in one year. (Code:RJ-S-Z9D)

BI-RADS CATEGORY  1: Negative.

## 2023-04-25 ENCOUNTER — Other Ambulatory Visit: Payer: Self-pay | Admitting: Internal Medicine

## 2023-05-02 ENCOUNTER — Other Ambulatory Visit: Payer: Self-pay | Admitting: Obstetrics and Gynecology

## 2023-05-02 DIAGNOSIS — Z1231 Encounter for screening mammogram for malignant neoplasm of breast: Secondary | ICD-10-CM

## 2023-05-04 ENCOUNTER — Ambulatory Visit
Admission: RE | Admit: 2023-05-04 | Discharge: 2023-05-04 | Disposition: A | Discharge status: Home / Self Care | Payer: 59 | Source: Ambulatory Visit | Attending: Obstetrics and Gynecology | Admitting: Obstetrics and Gynecology

## 2023-05-04 DIAGNOSIS — Z1231 Encounter for screening mammogram for malignant neoplasm of breast: Secondary | ICD-10-CM

## 2023-05-09 ENCOUNTER — Other Ambulatory Visit: Payer: Self-pay | Admitting: Internal Medicine

## 2023-05-09 DIAGNOSIS — F329 Major depressive disorder, single episode, unspecified: Secondary | ICD-10-CM

## 2023-05-09 MED ORDER — LISINOPRIL-HYDROCHLOROTHIAZIDE 20-12.5 MG PO TABS: 1.0000 | ORAL_TABLET | Freq: Every day | ORAL | 0 refills | Status: DC

## 2023-05-09 MED ORDER — BUPROPION HCL ER (XL) 150 MG PO TB24: 150.0000 mg | ORAL_TABLET | Freq: Every day | ORAL | 0 refills | Status: DC

## 2023-05-16 ENCOUNTER — Encounter: Payer: Self-pay | Admitting: Internal Medicine

## 2023-05-16 LAB — HM MAMMOGRAPHY

## 2023-05-23 ENCOUNTER — Encounter: Payer: 59 | Admitting: Internal Medicine

## 2023-06-01 ENCOUNTER — Other Ambulatory Visit: Payer: Self-pay | Admitting: Internal Medicine

## 2023-06-20 ENCOUNTER — Other Ambulatory Visit: Payer: Self-pay | Admitting: Internal Medicine

## 2023-07-15 ENCOUNTER — Other Ambulatory Visit: Payer: Self-pay | Admitting: Internal Medicine

## 2023-07-15 DIAGNOSIS — F329 Major depressive disorder, single episode, unspecified: Secondary | ICD-10-CM

## 2023-07-23 ENCOUNTER — Other Ambulatory Visit: Payer: Self-pay | Admitting: Internal Medicine

## 2023-07-23 DIAGNOSIS — F329 Major depressive disorder, single episode, unspecified: Secondary | ICD-10-CM

## 2023-08-23 ENCOUNTER — Encounter: Payer: Self-pay | Admitting: Internal Medicine

## 2023-08-23 ENCOUNTER — Telehealth (INDEPENDENT_AMBULATORY_CARE_PROVIDER_SITE_OTHER): Admitting: Internal Medicine

## 2023-08-23 VITALS — BP 120/80

## 2023-08-23 DIAGNOSIS — I10 Essential (primary) hypertension: Secondary | ICD-10-CM

## 2023-08-23 DIAGNOSIS — M2550 Pain in unspecified joint: Secondary | ICD-10-CM

## 2023-08-23 DIAGNOSIS — N951 Menopausal and female climacteric states: Secondary | ICD-10-CM | POA: Diagnosis not present

## 2023-08-23 DIAGNOSIS — R946 Abnormal results of thyroid function studies: Secondary | ICD-10-CM

## 2023-08-23 DIAGNOSIS — F329 Major depressive disorder, single episode, unspecified: Secondary | ICD-10-CM

## 2023-08-23 MED ORDER — LISINOPRIL-HYDROCHLOROTHIAZIDE 20-12.5 MG PO TABS: 1.0000 | ORAL_TABLET | Freq: Every day | ORAL | 3 refills | Status: AC

## 2023-08-23 MED ORDER — BUPROPION HCL ER (XL) 150 MG PO TB24: 150.0000 mg | ORAL_TABLET | Freq: Every day | ORAL | 3 refills | Status: AC

## 2023-08-23 NOTE — Assessment & Plan Note (Signed)
 Checking TSH as not checked in some years. No symptoms currently.

## 2023-08-23 NOTE — Assessment & Plan Note (Signed)
 Still if she does not exercise this will cause some issues but running regularly and not troubling her lately. Continue to monitor clinically.

## 2023-08-23 NOTE — Progress Notes (Signed)
 Virtual Visit via Video Note  I connected with Karen Gregory on 08/23/23 at  9:00 AM EDT by a video enabled telemedicine application and verified that I am speaking with the correct person using two identifiers.  The patient and the provider were at separate locations throughout the entire encounter. Patient location: home, Provider location: work   I discussed the limitations of evaluation and management by telemedicine and the availability of in person appointments. The patient expressed understanding and agreed to proceed. The patient and the provider were the only parties present for the visit unless noted in HPI below.  History of Present Illness: The patient is a 58 y.o. female with visit for for routine check of care. Has no new health troubles.   Observations/Objective: Appearance: normal, breathing appears normal, casual grooming, abdomen does not appear distended, memory normal, mental status is A and O times 3  Assessment and Plan: See problem oriented charting  Follow Up Instructions: Refill meds and checking labs for routine monitoring.  I discussed the assessment and treatment plan with the patient. The patient was provided an opportunity to ask questions and all were answered. The patient agreed with the plan and demonstrated an understanding of the instructions.   The patient was advised to call back or seek an in-person evaluation if the symptoms worsen or if the condition fails to improve as anticipated.  Myrlene Broker, MD

## 2023-08-23 NOTE — Assessment & Plan Note (Signed)
 Taking wellbutrin 150 mg daily and well controlled. She is out of this a couple of weeks will resume refilled today.

## 2023-08-23 NOTE — Assessment & Plan Note (Signed)
 BP at goal on lisinppril/hydrochlorothiazide 20/12.5. Will order CMP and CBC and lipid panel for monitoring and adjust as needed.

## 2024-01-04 ENCOUNTER — Telehealth: Admitting: Physician Assistant

## 2024-01-04 DIAGNOSIS — J069 Acute upper respiratory infection, unspecified: Secondary | ICD-10-CM

## 2024-01-04 DIAGNOSIS — B9689 Other specified bacterial agents as the cause of diseases classified elsewhere: Secondary | ICD-10-CM

## 2024-01-04 MED ORDER — AZITHROMYCIN 250 MG PO TABS: ORAL_TABLET | ORAL | 0 refills | Status: AC

## 2024-01-04 MED ORDER — LIDOCAINE VISCOUS HCL 2 % MT SOLN: 5.0000 mL | Freq: Four times a day (QID) | OROMUCOSAL | 0 refills | Status: AC | PRN

## 2024-01-04 MED ORDER — PSEUDOEPH-BROMPHEN-DM 30-2-10 MG/5ML PO SYRP: 5.0000 mL | ORAL_SOLUTION | Freq: Four times a day (QID) | ORAL | 0 refills | Status: AC | PRN

## 2024-01-04 NOTE — Progress Notes (Signed)
 Virtual Visit Consent   Karen Gregory, you are scheduled for a virtual visit with a [facility] provider today. Just as with appointments in the office, your consent must be obtained to participate. Your consent will be active for this visit and any virtual visit you may have with one of our providers in the next 365 days. If you have a MyChart account, a copy of this consent can be sent to you electronically.  As this is a virtual visit, video technology does not allow for your provider to perform a traditional examination. This may limit your provider's ability to fully assess your condition. If your provider identifies any concerns that need to be evaluated in person or the need to arrange testing (such as labs, EKG, etc.), we will make arrangements to do so. Although advances in technology are sophisticated, we cannot ensure that it will always work on either your end or our end. If the connection with a video visit is poor, the visit may have to be switched to a telephone visit. With either a video or telephone visit, we are not always able to ensure that we have a secure connection.  By engaging in this virtual visit, you consent to the provision of healthcare and authorize for your insurance to be billed (if applicable) for the services provided during this visit. Depending on your insurance coverage, you may receive a charge related to this service.  I need to obtain your verbal consent now. Are you willing to proceed with your visit today? Karen Gregory has provided verbal consent on 01/04/2024 for a virtual visit (video or telephone). Karen CHRISTELLA Dickinson, PA-C  Date: 01/04/2024 8:11 AM   Virtual Visit via Video Note   I, Karen Gregory, connected with  Karen Gregory  ([id number], December 09, 1965) on 01/04/24 at  8:00 AM EDT by a video-enabled telemedicine application and verified that I am speaking with the correct person using two identifiers.  Location: Patient: Virtual Visit  Location Patient: Home Provider: Virtual Visit Location Provider: Home Office   I discussed the limitations of evaluation and management by telemedicine and the availability of in person appointments. The patient expressed understanding and agreed to proceed.    History of Present Illness: Karen Gregory is a 58 y.o. who identifies as a female who was assigned female at birth, and is being seen today for cough and sore throat.  HPI: URI  This is a new problem. The current episode started in the past 7 days. The problem has been gradually worsening. There has been no fever. Associated symptoms include congestion, coughing, diarrhea (once), ear pain, headaches, a plugged ear sensation, a sore throat and swollen glands. Pertinent negatives include no chest pain, nausea, rhinorrhea, sinus pain, vomiting or wheezing. Associated symptoms comments: Hoarse voice, body aches, chills, hot flashes, decreased appetite. She has tried nothing for the symptoms. The treatment provided no relief.  At home Covid 19 testing is negative.    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   Abnormal thyroid  function test 10/03/2014   Visit for preventive health examination 09/10/2014    Allergies: No Known Allergies Medications:  Current Outpatient Medications:    azithromycin  (ZITHROMAX ) 250 MG tablet, Take 2 tablets on day 1, then 1 tablet daily on days 2 through 5, Disp: 6 tablet, Rfl: 0   brompheniramine-pseudoephedrine-DM 30-2-10 MG/5ML syrup, Take 5 mLs  by mouth 4 (four) times daily as needed., Disp: 120 mL, Rfl: 0   lidocaine  (XYLOCAINE ) 2 % solution, Use as directed 5-10 mLs in the mouth or throat every 6 (six) hours as needed (sore throat)., Disp: 100 mL, Rfl: 0   buPROPion  (WELLBUTRIN  XL) 150 MG 24 hr tablet, Take 1 tablet (150 mg total) by mouth daily., Disp: 90 tablet, Rfl: 3    Cholecalciferol (VITAMIN D3) 5000 units CAPS, Take 5,000 Units by mouth daily., Disp: , Rfl:    Ginger 500 MG CAPS, Take 1 capsule by mouth daily., Disp: , Rfl:    HM OMEGA-3-6-9 FATTY ACIDS CAPS, Take 2 each by mouth daily. Omega 3-5-6-7-9, Disp: , Rfl:    lisinopril -hydrochlorothiazide  (ZESTORETIC ) 20-12.5 MG tablet, Take 1 tablet by mouth daily., Disp: 90 tablet, Rfl: 3   Nutritional Supplements (JUICE PLUS FIBRE PO), Take 2 capsules by mouth daily. 2 Berries, 2 Vegs, 2 Fruits, Disp: , Rfl:    Probiotic Product (ALIGN) 4 MG CAPS, Take 1 capsule by mouth daily., Disp: , Rfl:    Turmeric 500 MG CAPS, Take 1 capsule by mouth daily., Disp: , Rfl:   Observations/Objective: Patient is well-developed, well-nourished in no acute distress.  Resting comfortably at home.  Head is normocephalic, atraumatic.  No labored breathing.  Speech is clear and coherent with logical content.  Patient is alert and oriented at baseline.    Assessment and Plan: 1. Bacterial upper respiratory infection (Primary) - azithromycin  (ZITHROMAX ) 250 MG tablet; Take 2 tablets on day 1, then 1 tablet daily on days 2 through 5  Dispense: 6 tablet; Refill: 0 - brompheniramine-pseudoephedrine-DM 30-2-10 MG/5ML syrup; Take 5 mLs by mouth 4 (four) times daily as needed.  Dispense: 120 mL; Refill: 0 - lidocaine  (XYLOCAINE ) 2 % solution; Use as directed 5-10 mLs in the mouth or throat every 6 (six) hours as needed (sore throat).  Dispense: 100 mL; Refill: 0  - Worsening despite OTC medications - Will treat with Z-pack and Bromfed DM - Can continue Mucinex  (PLAIN) - Push fluids.  - Rest.  - Steam and humidifier can help - Seek in person evaluation if worsening or symptoms fail to improve    Follow Up Instructions: I discussed the assessment and treatment plan with the patient. The patient was provided an opportunity to ask questions and all were answered. The patient agreed with the plan and demonstrated an understanding of  the instructions.  A copy of instructions were sent to the patient via MyChart unless otherwise noted below.    The patient was advised to call back or seek an in-person evaluation if the symptoms worsen or if the condition fails to improve as anticipated.    Karen CHRISTELLA Dickinson, PA-C

## 2024-01-04 NOTE — Patient Instructions (Signed)
 Nioka N Sipos, thank you for joining Delon CHRISTELLA Dickinson, PA-C for today's virtual visit.  While this provider is not your primary care provider (PCP), if your PCP is located in our provider database this encounter information will be shared with them immediately following your visit.   A [facility] MyChart account gives you access to today's visit and all your visits, tests, and labs performed at [facility]  click here if you don't have a [facility] MyChart account or go to [email or web address]  Consent: (Patient) Karen Gregory provided verbal consent for this virtual visit at the beginning of the encounter.  Current Medications:  Current Outpatient Medications:    azithromycin  (ZITHROMAX ) 250 MG tablet, Take 2 tablets on day 1, then 1 tablet daily on days 2 through 5, Disp: 6 tablet, Rfl: 0   brompheniramine-pseudoephedrine-DM 30-2-10 MG/5ML syrup, Take 5 mLs by mouth 4 (four) times daily as needed., Disp: 120 mL, Rfl: 0   lidocaine  (XYLOCAINE ) 2 % solution, Use as directed 5-10 mLs in the mouth or throat every 6 (six) hours as needed (sore throat)., Disp: 100 mL, Rfl: 0   buPROPion  (WELLBUTRIN  XL) 150 MG 24 hr tablet, Take 1 tablet (150 mg total) by mouth daily., Disp: 90 tablet, Rfl: 3   Cholecalciferol (VITAMIN D3) 5000 units CAPS, Take 5,000 Units by mouth daily., Disp: , Rfl:    Ginger 500 MG CAPS, Take 1 capsule by mouth daily., Disp: , Rfl:    HM OMEGA-3-6-9 FATTY ACIDS CAPS, Take 2 each by mouth daily. Omega 3-5-6-7-9, Disp: , Rfl:    lisinopril -hydrochlorothiazide  (ZESTORETIC ) 20-12.5 MG tablet, Take 1 tablet by mouth daily., Disp: 90 tablet, Rfl: 3   Nutritional Supplements (JUICE PLUS FIBRE PO), Take 2 capsules by mouth daily. 2 Berries, 2 Vegs, 2 Fruits, Disp: , Rfl:    Probiotic Product (ALIGN) 4 MG CAPS, Take 1 capsule by mouth daily., Disp: , Rfl:    Turmeric 500 MG CAPS, Take 1 capsule by mouth daily., Disp: , Rfl:    Medications ordered in this  encounter:  Meds ordered this encounter  Medications   azithromycin  (ZITHROMAX ) 250 MG tablet    Sig: Take 2 tablets on day 1, then 1 tablet daily on days 2 through 5    Dispense:  6 tablet    Refill:  0    Supervising Provider:   LAMPTEY, PHILIP O [[id number]]   brompheniramine-pseudoephedrine-DM 30-2-10 MG/5ML syrup    Sig: Take 5 mLs by mouth 4 (four) times daily as needed.    Dispense:  120 mL    Refill:  0    Supervising Provider:   LAMPTEY, PHILIP O [[id number]]   lidocaine  (XYLOCAINE ) 2 % solution    Sig: Use as directed 5-10 mLs in the mouth or throat every 6 (six) hours as needed (sore throat).    Dispense:  100 mL    Refill:  0    Supervising Provider:   BLAISE ALEENE KIDD [[id number]]     *If you need refills on other medications prior to your next appointment, please contact your pharmacy*  Follow-Up: Call back or seek an in-person evaluation if the symptoms worsen or if the condition fails to improve as anticipated.  [facility] [phone]  Other Instructions Upper Respiratory Infection, Adult An upper respiratory infection (URI) is a common viral infection of the nose, throat, and upper air passages that lead to the lungs. The most common type of URI is the common cold. URIs usually  get better on their own, without medical treatment. What are the causes? A URI is caused by a virus. You may catch a virus by: Breathing in droplets from an infected person's cough or sneeze. Touching something that has been exposed to the virus (is contaminated) and then touching your mouth, nose, or eyes. What increases the risk? You are more likely to get a URI if: You are very young or very old. You have close contact with others, such as at work, school, or a health care facility. You smoke. You have long-term (chronic) heart or lung disease. You have a weakened disease-fighting system (immune system). You have nasal allergies or asthma. You are experiencing a lot of  stress. You have poor nutrition. What are the signs or symptoms? A URI usually involves some of the following symptoms: Runny or stuffy (congested) nose. Cough. Sneezing. Sore throat. Headache. Fatigue. Fever. Loss of appetite. Pain in your forehead, behind your eyes, and over your cheekbones (sinus pain). Muscle aches. Redness or irritation of the eyes. Pressure in the ears or face. How is this diagnosed? This condition may be diagnosed based on your medical history and symptoms, and a physical exam. Your health care provider may use a swab to take a mucus sample from your nose (nasal swab). This sample can be tested to determine what virus is causing the illness. How is this treated? URIs usually get better on their own within 7-10 days. Medicines cannot cure URIs, but your health care provider may recommend certain medicines to help relieve symptoms, such as: Over-the-counter cold medicines. Cough suppressants. Coughing is a type of defense against infection that helps to clear the respiratory system, so take these medicines only as recommended by your health care provider. Fever-reducing medicines. Follow these instructions at home: Activity Rest as needed. If you have a fever, stay home from work or school until your fever is gone or until your health care provider says your URI cannot spread to other people (is no longer contagious). Your health care provider may have you wear a face mask to prevent your infection from spreading. Relieving symptoms Gargle with a mixture of salt and water 3-4 times a day or as needed. To make salt water, completely dissolve -1 tsp (3-6 g) of salt in 1 cup (237 mL) of warm water. Use a cool-mist humidifier to add moisture to the air. This can help you breathe more easily. Eating and drinking  Drink enough fluid to keep your urine pale yellow. Eat soups and other clear broths. General instructions  Take over-the-counter and prescription  medicines only as told by your health care provider. These include cold medicines, fever reducers, and cough suppressants. Do not use any products that contain nicotine or tobacco. These products include cigarettes, chewing tobacco, and vaping devices, such as e-cigarettes. If you need help quitting, ask your health care provider. Stay away from secondhand smoke. Stay up to date on all immunizations, including the yearly (annual) flu vaccine. Keep all follow-up visits. This is important. How to prevent the spread of infection to others URIs can be contagious. To prevent the infection from spreading: Wash your hands with soap and water for at least 20 seconds. If soap and water are not available, use hand sanitizer. Avoid touching your mouth, face, eyes, or nose. Cough or sneeze into a tissue or your sleeve or elbow instead of into your hand or into the air.  Contact a health care provider if: You are getting worse instead of better.  You have a fever or chills. Your mucus is brown or red. You have yellow or brown discharge coming from your nose. You have pain in your face, especially when you bend forward. You have swollen neck glands. You have pain while swallowing. You have white areas in the back of your throat. Get help right away if: You have shortness of breath that gets worse. You have severe or persistent: Headache. Ear pain. Sinus pain. Chest pain. You have chronic lung disease along with any of the following: Making high-pitched whistling sounds when you breathe, most often when you breathe out (wheezing). Prolonged cough (more than 14 days). Coughing up blood. A change in your usual mucus. You have a stiff neck. You have changes in your: Vision. Hearing. Thinking. Mood. These symptoms may be an emergency. Get help right away. Call 911. Do not wait to see if the symptoms will go away. Do not drive yourself to the hospital. Summary An upper respiratory infection  (URI) is a common infection of the nose, throat, and upper air passages that lead to the lungs. A URI is caused by a virus. URIs usually get better on their own within 7-10 days. Medicines cannot cure URIs, but your health care provider may recommend certain medicines to help relieve symptoms. This information is not intended to replace advice given to you by your health care provider. Make sure you discuss any questions you have with your health care provider. Document Revised: 12/03/2020 Document Reviewed: 12/03/2020 Elsevier Patient Education  2024 [employer]   If you have been instructed to have an in-person evaluation today at a local Urgent Care facility, please use the link below. It will take you to a list of all of our available [facility], including address, phone number and hours of operation. Please do not delay care.  [facility]  If you or a family member do not have a primary care provider, use the link below to schedule a visit and establish care. When you choose a [facility] primary care physician or advanced practice provider, you gain a long-term partner in health. Find a Primary Care Provider  Learn more about [facility]'s in-office and virtual care options: [facility] - Get Care Now
